# Patient Record
Sex: Male | Born: 1955 | Race: White | Hispanic: No | Marital: Married | Smoking: Never smoker
Health system: Southern US, Community
[De-identification: ages and names within clinical notes are randomized; demographics above are authoritative.]

## PROBLEM LIST (undated history)

## (undated) DIAGNOSIS — I10 Essential (primary) hypertension: Secondary | ICD-10-CM

## (undated) DIAGNOSIS — G4730 Sleep apnea, unspecified: Secondary | ICD-10-CM

## (undated) DIAGNOSIS — M51369 Other intervertebral disc degeneration, lumbar region without mention of lumbar back pain or lower extremity pain: Secondary | ICD-10-CM

## (undated) DIAGNOSIS — M5136 Other intervertebral disc degeneration, lumbar region: Secondary | ICD-10-CM

## (undated) DIAGNOSIS — E119 Type 2 diabetes mellitus without complications: Secondary | ICD-10-CM

## (undated) DIAGNOSIS — M7918 Myalgia, other site: Secondary | ICD-10-CM

## (undated) DIAGNOSIS — M7542 Impingement syndrome of left shoulder: Secondary | ICD-10-CM

## (undated) DIAGNOSIS — Z87438 Personal history of other diseases of male genital organs: Secondary | ICD-10-CM

## (undated) HISTORY — PX: BACK SURGERY: SHX140

## (undated) HISTORY — DX: Essential (primary) hypertension: I10

## (undated) HISTORY — PX: COLONOSCOPY: SHX174

## (undated) HISTORY — PX: WISDOM TOOTH EXTRACTION: SHX21

---

## 2004-06-03 ENCOUNTER — Emergency Department: Payer: Self-pay | Admitting: Emergency Medicine

## 2006-09-04 ENCOUNTER — Ambulatory Visit: Payer: Self-pay | Admitting: Gastroenterology

## 2007-09-16 ENCOUNTER — Ambulatory Visit: Payer: Self-pay | Admitting: General Practice

## 2009-03-02 ENCOUNTER — Ambulatory Visit: Payer: Self-pay | Admitting: Neurosurgery

## 2009-03-12 ENCOUNTER — Encounter: Admission: RE | Admit: 2009-03-12 | Discharge: 2009-03-12 | Payer: Self-pay | Admitting: Neurosurgery

## 2009-03-23 ENCOUNTER — Ambulatory Visit (HOSPITAL_COMMUNITY): Admission: RE | Admit: 2009-03-23 | Discharge: 2009-03-23 | Payer: Self-pay | Admitting: Neurosurgery

## 2010-05-22 ENCOUNTER — Encounter: Payer: Self-pay | Admitting: Neurosurgery

## 2010-08-03 LAB — DIFFERENTIAL
Basophils Absolute: 0 10*3/uL (ref 0.0–0.1)
Basophils Relative: 0 % (ref 0–1)
Neutro Abs: 4.5 10*3/uL (ref 1.7–7.7)
Neutrophils Relative %: 62 % (ref 43–77)

## 2010-08-03 LAB — CBC
MCHC: 33.5 g/dL (ref 30.0–36.0)
Platelets: 238 10*3/uL (ref 150–400)
RDW: 13.8 % (ref 11.5–15.5)

## 2010-08-03 LAB — BASIC METABOLIC PANEL
CO2: 26 mEq/L (ref 19–32)
Calcium: 10.2 mg/dL (ref 8.4–10.5)
Creatinine, Ser: 0.8 mg/dL (ref 0.4–1.5)
Glucose, Bld: 125 mg/dL — ABNORMAL HIGH (ref 70–99)

## 2017-03-02 DIAGNOSIS — M7918 Myalgia, other site: Secondary | ICD-10-CM | POA: Insufficient documentation

## 2017-03-02 DIAGNOSIS — Z87438 Personal history of other diseases of male genital organs: Secondary | ICD-10-CM | POA: Insufficient documentation

## 2017-03-02 DIAGNOSIS — M5136 Other intervertebral disc degeneration, lumbar region: Secondary | ICD-10-CM | POA: Insufficient documentation

## 2017-03-02 DIAGNOSIS — M7542 Impingement syndrome of left shoulder: Secondary | ICD-10-CM | POA: Insufficient documentation

## 2017-06-14 ENCOUNTER — Encounter: Payer: Self-pay | Admitting: *Deleted

## 2017-06-15 ENCOUNTER — Encounter: Payer: Self-pay | Admitting: *Deleted

## 2017-06-15 ENCOUNTER — Other Ambulatory Visit: Payer: Self-pay

## 2017-06-15 ENCOUNTER — Ambulatory Visit: Payer: BLUE CROSS/BLUE SHIELD | Admitting: Anesthesiology

## 2017-06-15 ENCOUNTER — Encounter
Admission: RE | Disposition: A | Discharge status: Home / Self Care | Payer: Self-pay | Source: Ambulatory Visit | Attending: Unknown Physician Specialty

## 2017-06-15 ENCOUNTER — Ambulatory Visit
Admission: RE | Admit: 2017-06-15 | Discharge: 2017-06-15 | Disposition: A | Discharge status: Home / Self Care | Payer: BLUE CROSS/BLUE SHIELD | Source: Ambulatory Visit | Attending: Unknown Physician Specialty | Admitting: Unknown Physician Specialty

## 2017-06-15 DIAGNOSIS — Z1211 Encounter for screening for malignant neoplasm of colon: Secondary | ICD-10-CM | POA: Diagnosis not present

## 2017-06-15 DIAGNOSIS — Z87891 Personal history of nicotine dependence: Secondary | ICD-10-CM | POA: Insufficient documentation

## 2017-06-15 DIAGNOSIS — K5730 Diverticulosis of large intestine without perforation or abscess without bleeding: Secondary | ICD-10-CM | POA: Insufficient documentation

## 2017-06-15 DIAGNOSIS — E119 Type 2 diabetes mellitus without complications: Secondary | ICD-10-CM | POA: Insufficient documentation

## 2017-06-15 DIAGNOSIS — K640 First degree hemorrhoids: Secondary | ICD-10-CM | POA: Insufficient documentation

## 2017-06-15 DIAGNOSIS — G4730 Sleep apnea, unspecified: Secondary | ICD-10-CM | POA: Diagnosis not present

## 2017-06-15 HISTORY — PX: COLONOSCOPY WITH PROPOFOL: SHX5780

## 2017-06-15 HISTORY — DX: Sleep apnea, unspecified: G47.30

## 2017-06-15 HISTORY — DX: Type 2 diabetes mellitus without complications: E11.9

## 2017-06-15 HISTORY — DX: Personal history of other diseases of male genital organs: Z87.438

## 2017-06-15 HISTORY — DX: Impingement syndrome of left shoulder: M75.42

## 2017-06-15 HISTORY — DX: Myalgia, other site: M79.18

## 2017-06-15 HISTORY — DX: Other intervertebral disc degeneration, lumbar region: M51.36

## 2017-06-15 HISTORY — DX: Other intervertebral disc degeneration, lumbar region without mention of lumbar back pain or lower extremity pain: M51.369

## 2017-06-15 SURGERY — COLONOSCOPY WITH PROPOFOL
Anesthesia: General

## 2017-06-15 MED ORDER — LIDOCAINE HCL (PF) 2 % IJ SOLN
INTRAMUSCULAR | Status: AC
  Filled 2017-06-15: qty 10

## 2017-06-15 MED ORDER — FENTANYL CITRATE (PF) 100 MCG/2ML IJ SOLN
INTRAMUSCULAR | Status: DC | PRN
  Administered 2017-06-15: 50 ug via INTRAVENOUS

## 2017-06-15 MED ORDER — SODIUM CHLORIDE 0.9 % IV SOLN
INTRAVENOUS | Status: DC
  Administered 2017-06-15 (×2): via INTRAVENOUS

## 2017-06-15 MED ORDER — PROPOFOL 500 MG/50ML IV EMUL
INTRAVENOUS | Status: DC | PRN
  Administered 2017-06-15: 160 ug/kg/min via INTRAVENOUS

## 2017-06-15 MED ORDER — SODIUM CHLORIDE 0.9 % IV SOLN: INTRAVENOUS | Status: DC

## 2017-06-15 MED ORDER — PROPOFOL 10 MG/ML IV BOLUS
INTRAVENOUS | Status: DC | PRN
  Administered 2017-06-15: 100 mg via INTRAVENOUS

## 2017-06-15 MED ORDER — FENTANYL CITRATE (PF) 100 MCG/2ML IJ SOLN
INTRAMUSCULAR | Status: AC
  Filled 2017-06-15: qty 2

## 2017-06-15 MED ORDER — PROPOFOL 10 MG/ML IV BOLUS
INTRAVENOUS | Status: AC
  Filled 2017-06-15: qty 20

## 2017-06-15 MED ORDER — PROPOFOL 500 MG/50ML IV EMUL
INTRAVENOUS | Status: AC
  Filled 2017-06-15: qty 50

## 2017-06-15 MED ORDER — SODIUM CHLORIDE 0.9 % IJ SOLN
INTRAMUSCULAR | Status: AC
  Filled 2017-06-15: qty 10

## 2017-06-15 MED ORDER — PHENYLEPHRINE HCL 10 MG/ML IJ SOLN
INTRAMUSCULAR | Status: AC
Start: 2017-06-15 — End: ?
  Filled 2017-06-15: qty 1

## 2017-06-15 MED ORDER — LIDOCAINE 2% (20 MG/ML) 5 ML SYRINGE
INTRAMUSCULAR | Status: DC | PRN
  Administered 2017-06-15: 30 mg via INTRAVENOUS

## 2017-06-15 MED ORDER — PHENYLEPHRINE HCL 10 MG/ML IJ SOLN
INTRAMUSCULAR | Status: DC | PRN
  Administered 2017-06-15: 100 ug via INTRAVENOUS

## 2017-06-15 NOTE — Anesthesia Post-op Follow-up Note (Signed)
Anesthesia QCDR form completed.        

## 2017-06-15 NOTE — Op Note (Signed)
[facility] Gastroenterology Patient Name: Mitchell Whitehead Procedure Date: 06/15/2017 7:18 AM MRN: [id number] Account #: [id number] Date of Birth: Apr 27, 1956 Admit Type: Outpatient Age: 62 Room: [facility] ENDO ROOM 1 Gender: Male Note Status: Finalized Procedure:            Colonoscopy Indications:          Screening for colorectal malignant neoplasm Providers:            Scot Jun, MD Referring MD:         Bunnie Philips. Rabinovich (Referring MD) Medicines:            Propofol per Anesthesia Complications:        No immediate complications. Procedure:            Pre-Anesthesia Assessment:                       - After reviewing the risks and benefits, the patient                        was deemed in satisfactory condition to undergo the                        procedure.                       After obtaining informed consent, the colonoscope was                        passed under direct vision. Throughout the procedure,                        the patient's blood pressure, pulse, and oxygen                        saturations were monitored continuously. The                        Colonoscope was introduced through the anus and                        advanced to the the cecum, identified by appendiceal                        orifice and ileocecal valve. The patient tolerated the                        procedure well. The quality of the bowel preparation                        was good. Findings:      A single small-mouthed diverticulum was found in the ascending colon.      Internal hemorrhoids were found during endoscopy. The hemorrhoids were       small and Grade I (internal hemorrhoids that do not prolapse).      The exam was otherwise without abnormality. Impression:           - Diverticulosis in the ascending colon.                       - Internal hemorrhoids.                       -  The examination was otherwise normal.                       - No specimens  collected. Recommendation:       - The findings and recommendations were discussed with                        the patient's family. Scot Junobert T Kendrea Cerritos, MD 06/15/2017 7:58:29 AM This report has been signed electronically. Number of Addenda: 0 Note Initiated On: 06/15/2017 7:18 AM Scope Withdrawal Time: 0 hours 8 minutes 36 seconds  Total Procedure Duration: 0 hours 15 minutes 15 seconds       [facility]

## 2017-06-15 NOTE — Anesthesia Preprocedure Evaluation (Signed)
Anesthesia Evaluation  Patient identified by MRN, date of birth, ID band Patient awake    Reviewed: Allergy & Precautions, NPO status , Patient's Chart, lab work & pertinent test results, reviewed documented beta blocker date and time   Airway Mallampati: III  TM Distance: >3 FB     Dental  (+) Chipped   Pulmonary sleep apnea , former smoker,           Cardiovascular      Neuro/Psych    GI/Hepatic   Endo/Other  diabetes, Type 2  Renal/GU      Musculoskeletal  (+) Arthritis ,   Abdominal   Peds  Hematology   Anesthesia Other Findings   Reproductive/Obstetrics                             Anesthesia Physical Anesthesia Plan  ASA: III  Anesthesia Plan: General   Post-op Pain Management:    Induction: Intravenous  PONV Risk Score and Plan:   Airway Management Planned:   Additional Equipment:   Intra-op Plan:   Post-operative Plan:   Informed Consent: I have reviewed the patients History and Physical, chart, labs and discussed the procedure including the risks, benefits and alternatives for the proposed anesthesia with the patient or authorized representative who has indicated his/her understanding and acceptance.     Plan Discussed with: CRNA  Anesthesia Plan Comments:         Anesthesia Quick Evaluation

## 2017-06-15 NOTE — Anesthesia Postprocedure Evaluation (Signed)
Anesthesia Post Note  Patient: Mitchell Whitehead  Procedure(s) Performed: COLONOSCOPY WITH PROPOFOL (N/A )  Patient location during evaluation: Endoscopy Anesthesia Type: General Level of consciousness: awake and alert Pain management: pain level controlled Vital Signs Assessment: post-procedure vital signs reviewed and stable Respiratory status: spontaneous breathing, nonlabored ventilation, respiratory function stable and patient connected to nasal cannula oxygen Cardiovascular status: blood pressure returned to baseline and stable Postop Assessment: no apparent nausea or vomiting Anesthetic complications: no     Last Vitals:  Vitals:   06/15/17 0807 06/15/17 0841  BP: 129/73 140/82  Pulse:  70  Resp:  18  Temp:    SpO2:  100%    Last Pain:  Vitals:   06/15/17 0801  TempSrc: Tympanic                 Maeson Purohit S

## 2017-06-15 NOTE — Transfer of Care (Signed)
Immediate Anesthesia Transfer of Care Note  Patient: Mitchell Whitehead  Procedure(s) Performed: COLONOSCOPY WITH PROPOFOL (N/A )  Patient Location: PACU and Endoscopy Unit  Anesthesia Type:General  Level of Consciousness: sedated  Airway & Oxygen Therapy: Patient Spontanous Breathing and Patient connected to nasal cannula oxygen  Post-op Assessment: Report given to RN and Post -op Vital signs reviewed and stable  Post vital signs: Reviewed and stable  Last Vitals:  Vitals:   06/15/17 0707  BP: (!) 148/94  Pulse: 77  Resp: 20  Temp: (!) 35.7 C  SpO2: 99%    Last Pain:  Vitals:   06/15/17 0707  TempSrc: Tympanic      Patients Stated Pain Goal: 9 (06/15/17 0707)  Complications: No apparent anesthesia complications

## 2017-06-15 NOTE — H&P (Signed)
   Primary Care Physician:  Gilles Chiquitoabinowitz, Phill H, MD Primary Gastroenterologist:  Dr. Mechele CollinElliott  Pre-Procedure History & Physical: HPI:  Mitchell NewerJoseph D Hodges is a 62 y.o. male is here for an colonoscopy.   Past Medical History:  Diagnosis Date  . Degenerative disc disease, lumbar   . Diabetes mellitus without complication (HCC)   . History of prostatitis   . Impingement syndrome of left shoulder   . Left buttock pain   . Sleep apnea     Past Surgical History:  Procedure Laterality Date  . BACK SURGERY    . COLONOSCOPY    . WISDOM TOOTH EXTRACTION      Prior to Admission medications   Medication Sig Start Date End Date Taking? Authorizing Provider  ibuprofen (ADVIL,MOTRIN) 200 MG tablet Take 200 mg by mouth every 6 (six) hours as needed for moderate pain.   Yes [provider]  meloxicam (MOBIC) 15 MG tablet Take 15 mg by mouth as needed for pain.   Yes [provider]    Allergies as of 03/19/2017  . (Not on File)    History reviewed. No pertinent family history.  Social History   Socioeconomic History  . Marital status: Married    Spouse name: Not on file  . Number of children: Not on file  . Years of education: Not on file  . Highest education level: Not on file  Social Needs  . Financial resource strain: Not on file  . Food insecurity - worry: Not on file  . Food insecurity - inability: Not on file  . Transportation needs - medical: Not on file  . Transportation needs - non-medical: Not on file  Occupational History  . Not on file  Tobacco Use  . Smoking status: Former Games developermoker  . Smokeless tobacco: Never Used  Substance and Sexual Activity  . Alcohol use: Yes    Alcohol/week: 4.2 oz    Types: 7 Cans of beer per week    Comment: Daily or almost daily   . Drug use: No  . Sexual activity: Yes  Other Topics Concern  . Not on file  Social History Narrative  . Not on file    Review of Systems: See HPI, otherwise negative ROS-----Patient with  elevated A1C of 11.5 and was recommended to see doctor about this as he is not taking any medicine.  Physical Exam: BP (!) 148/94   Pulse 77   Temp (!) 96.2 F (35.7 C) (Tympanic)   Resp 20   Ht 6\' 1"  (1.854 m)   Wt 99.8 kg (220 lb)   SpO2 99%   BMI 29.03 kg/m  General:   Alert,  pleasant and cooperative in NAD Head:  Normocephalic and atraumatic. Neck:  Supple; no masses or thyromegaly. Lungs:  Clear throughout to auscultation.    Heart:  Regular rate and rhythm. Abdomen:  Soft, nontender and nondistended. Normal bowel sounds, without guarding, and without rebound.   Neurologic:  Alert and  oriented x4;  grossly normal neurologically.  Impression/Plan: Mitchell Whitehead is here for an colonoscopy to be performed for screening colonoscopy.  Risks, benefits, limitations, and alternatives regarding  colonoscopy have been reviewed with the patient.  Questions have been answered.  All parties agreeable.   Lynnae PrudeELLIOTT, Joanny Dupree, MD  06/15/2017, 7:30 AM

## 2017-06-16 NOTE — Progress Notes (Signed)
Voicemail. No message left.

## 2017-06-18 ENCOUNTER — Encounter: Payer: Self-pay | Admitting: Unknown Physician Specialty

## 2018-05-22 ENCOUNTER — Other Ambulatory Visit: Payer: Self-pay | Admitting: Physician Assistant

## 2018-05-22 DIAGNOSIS — R221 Localized swelling, mass and lump, neck: Secondary | ICD-10-CM

## 2018-05-28 ENCOUNTER — Ambulatory Visit
Admission: RE | Admit: 2018-05-28 | Discharge: 2018-05-28 | Disposition: A | Discharge status: Home / Self Care | Payer: BLUE CROSS/BLUE SHIELD | Source: Ambulatory Visit | Attending: Physician Assistant | Admitting: Physician Assistant

## 2018-05-28 DIAGNOSIS — R221 Localized swelling, mass and lump, neck: Secondary | ICD-10-CM | POA: Diagnosis not present

## 2018-12-23 ENCOUNTER — Other Ambulatory Visit: Payer: Self-pay

## 2018-12-23 ENCOUNTER — Ambulatory Visit: Payer: 59

## 2018-12-23 DIAGNOSIS — Z01818 Encounter for other preprocedural examination: Secondary | ICD-10-CM

## 2018-12-23 LAB — POCT URINALYSIS DIPSTICK
Bilirubin, UA: NEGATIVE
Blood, UA: NEGATIVE
Glucose, UA: POSITIVE — AB
Ketones, UA: NEGATIVE
Leukocytes, UA: NEGATIVE
Nitrite, UA: NEGATIVE
Protein, UA: NEGATIVE
Spec Grav, UA: 1.01 (ref 1.010–1.025)
Urobilinogen, UA: 0.2 E.U./dL
pH, UA: 6 (ref 5.0–8.0)

## 2018-12-24 LAB — CMP12+LP+TP+TSH+6AC+PSA+CBC…
Albumin: 4.3 g/dL (ref 3.8–4.8)
Alkaline Phosphatase: 61 IU/L (ref 39–117)
BUN/Creatinine Ratio: 24 (ref 10–24)
BUN: 19 mg/dL (ref 8–27)
Basos: 1 %
Bilirubin Total: 0.5 mg/dL (ref 0.0–1.2)
Chol/HDL Ratio: 5.9 ratio — ABNORMAL HIGH (ref 0.0–5.0)
Cholesterol, Total: 272 mg/dL — ABNORMAL HIGH (ref 100–199)
Creatinine, Ser: 0.79 mg/dL (ref 0.76–1.27)
EOS (ABSOLUTE): 0.2 10*3/uL (ref 0.0–0.4)
Eos: 4 %
Estimated CHD Risk: 1.3 times avg. — ABNORMAL HIGH (ref 0.0–1.0)
Free Thyroxine Index: 1.6 (ref 1.2–4.9)
GFR calc Af Amer: 110 mL/min/{1.73_m2} (ref >59)
GFR calc non Af Amer: 96 mL/min/{1.73_m2} (ref >59)
Globulin, Total: 2.6 g/dL (ref 1.5–4.5)
Glucose: 167 mg/dL — ABNORMAL HIGH (ref 65–99)
HDL: 46 mg/dL (ref >39)
Hematocrit: 47.6 % (ref 37.5–51.0)
Hemoglobin: 15.8 g/dL (ref 13.0–17.7)
Immature Grans (Abs): 0 10*3/uL (ref 0.0–0.1)
Immature Granulocytes: 0 %
Iron: 152 ug/dL (ref 38–169)
LDL Calculated: 192 mg/dL — ABNORMAL HIGH (ref 0–99)
Lymphocytes Absolute: 1.5 10*3/uL (ref 0.7–3.1)
Lymphs: 29 %
MCHC: 33.2 g/dL (ref 31.5–35.7)
MCV: 88 fL (ref 79–97)
Monocytes Absolute: 0.7 10*3/uL (ref 0.1–0.9)
Monocytes: 13 %
Neutrophils Absolute: 2.8 10*3/uL (ref 1.4–7.0)
Phosphorus: 3.4 mg/dL (ref 2.8–4.1)
Potassium: 4.1 mmol/L (ref 3.5–5.2)
RDW: 13.9 % (ref 11.6–15.4)
T3 Uptake Ratio: 28 % (ref 24–39)
TSH: 1.65 u[IU]/mL (ref 0.450–4.500)
Total Protein: 6.9 g/dL (ref 6.0–8.5)
Uric Acid: 5.8 mg/dL (ref 3.7–8.6)
VLDL Cholesterol Cal: 34 mg/dL (ref 5–40)

## 2018-12-24 LAB — CMP12+LP+TP+TSH+6AC+PSA+CBC?
ALT: 22 IU/L (ref 0–44)
AST: 13 IU/L (ref 0–40)
Albumin/Globulin Ratio: 1.7 (ref 1.2–2.2)
Basophils Absolute: 0.1 10*3/uL (ref 0.0–0.2)
Calcium: 9.8 mg/dL (ref 8.6–10.2)
Chloride: 101 mmol/L (ref 96–106)
GGT: 54 IU/L (ref 0–65)
LDH: 144 IU/L (ref 121–224)
MCH: 29.1 pg (ref 26.6–33.0)
Neutrophils: 53 %
Platelets: 243 10*3/uL (ref 150–450)
Prostate Specific Ag, Serum: 0.3 ng/mL (ref 0.0–4.0)
RBC: 5.43 x10E6/uL (ref 4.14–5.80)
Sodium: 137 mmol/L (ref 134–144)
T4, Total: 5.8 ug/dL (ref 4.5–12.0)
Triglycerides: 172 mg/dL — ABNORMAL HIGH (ref 0–149)
WBC: 5.2 10*3/uL (ref 3.4–10.8)

## 2018-12-24 LAB — MICROALBUMIN / CREATININE URINE RATIO
Creatinine, Urine: 17.4 mg/dL
Microalb/Creat Ratio: <17 mg/g creat (ref 0–29)
Microalbumin, Urine: <3 ug/mL

## 2018-12-24 LAB — HGB A1C W/O EAG: Hgb A1c MFr Bld: 7.1 % — ABNORMAL HIGH (ref 4.8–5.6)

## 2018-12-26 ENCOUNTER — Encounter: Payer: 59 | Admitting: Internal Medicine

## 2019-01-02 ENCOUNTER — Ambulatory Visit: Payer: Self-pay | Admitting: Internal Medicine

## 2019-01-02 ENCOUNTER — Other Ambulatory Visit: Payer: Self-pay

## 2019-01-02 ENCOUNTER — Encounter: Payer: Self-pay | Admitting: Internal Medicine

## 2019-01-02 VITALS — BP 141/83 | HR 90 | Temp 98.5°F | Resp 16 | Ht 73.0 in | Wt 224.0 lb

## 2019-01-02 DIAGNOSIS — I10 Essential (primary) hypertension: Secondary | ICD-10-CM

## 2019-01-02 DIAGNOSIS — Z9889 Other specified postprocedural states: Secondary | ICD-10-CM | POA: Insufficient documentation

## 2019-01-02 DIAGNOSIS — E119 Type 2 diabetes mellitus without complications: Secondary | ICD-10-CM | POA: Insufficient documentation

## 2019-01-02 DIAGNOSIS — E6609 Other obesity due to excess calories: Secondary | ICD-10-CM | POA: Insufficient documentation

## 2019-01-02 DIAGNOSIS — G4733 Obstructive sleep apnea (adult) (pediatric): Secondary | ICD-10-CM | POA: Insufficient documentation

## 2019-01-02 DIAGNOSIS — E7849 Other hyperlipidemia: Secondary | ICD-10-CM | POA: Insufficient documentation

## 2019-01-02 MED ORDER — ATORVASTATIN CALCIUM 10 MG PO TABS: 10.0000 mg | ORAL_TABLET | Freq: Every day | ORAL | 3 refills | Status: DC

## 2019-01-02 NOTE — Progress Notes (Signed)
S  - 63 y.o. white male who presents for annual physical evaluation  No specific complaints today, denies any recent CP, palpitations, SOB, abdominal pains, change in bowel habits, dark/black stools, vision changes and sees his eye doctor yearly, recent fevers, or other Covid concerning sx's, up once a night at most to pee, sometimes not at all, no hesitancy, no dysuria, no LE edema, no joint pains, no numbness or tingling in his ext's, notes occas LB pains, not severe and manageable  He was dx'ed with sleep apnea in his past, dx'ed years ago and tried CPAP and not do well with this and not use since, he denies any problems with marked daytime somnolence, noted sometimes fatigued in day, does snore per his wife. He is not anxious for another trial at this time.  Exercise - no regular exercise Diet - not adherent to a very healthy diet noted  Meds reviewed Current Outpatient Medications on File Prior to Visit  Medication Sig Dispense Refill  . FARXIGA 10 MG TABS tablet TK 1 T PO QD    . glipiZIDE (GLUCOTROL XL) 5 MG 24 hr tablet TK 1 T PO QD    . ibuprofen (ADVIL,MOTRIN) 200 MG tablet Take 200 mg by mouth every 6 (six) hours as needed for moderate pain.    Marland Kitchen losartan (COZAAR) 50 MG tablet TK 1 T PO QD     No current facility-administered medications on file prior to visit.      Allergies  Allergen Reactions  . Grapeseed Extract [Nutritional Supplements]     Social History   Tobacco Use  Smoking Status Never Smoker  Smokeless Tobacco Never Used     FH - M - lung CA  O - NAD, masked, overweight  BP (!) 141/83 (BP Location: Left Arm, Patient Position: Sitting, Cuff Size: Large)   Pulse 90   Temp 98.5 F (36.9 C) (Oral)   Resp 16   Ht 6\' 1"  (1.854 m)   Wt 224 lb (101.6 kg)   SpO2 96%   BMI 29.55 kg/m   HEENT - sclera anicteric, + glasses, PERRL, EOMI, conj - non-inj'ed, No sinus tenderness, TM's and canals clear Neck - supple, no adenopathy, no TM, carotids 2+ and = without  bruits bilat Car - RRR without m/g/r Pulm- CTA without wheeze or rales Abd - soft, obese, NT, ND, BS+, no obvious HSM, no masses Back - no CVA tenderness Skin- no new lesions of concern on exposed areas, denied otherwise, + skin tag left neck and right lower back towards flank, + scar lower back from surgery,  Ext - no LE edema, no active joints GU - no swelling in inguinal/suprapubic region, NT,  testicle and prostate exams deferred (without concerning sx's and after discussion on current recommendations for prostate CA screening including PSA tests) Neuro - affect was not flat, appropriate with conversation  Grossly non-focal with good strength on testing, sensation intact to LT in distal extremities, DTR's 2+ and = patella, Romberg neg, no pronator drift, good balance on one foot, good finger to nose   Labs reviewed - UA - + for glc (from medicinel taking), glc - 167, A1C - 7.1 (7.7 in May 2019 and in the 11's on a couple checks prior), LDL - 192, TC - 272, TG - 172, HDL - 46, PSA - 0.3, microalb neg ECG reviewed - no concerning changes from prior ECG Colonoscopy screening discussed and reviewed - last one 06/2017 - clean and f/u in 10 years  rec'ed  Ass/Plan: 1. HTN - controlled on medicines, systolic BP just borderline today  Continue medication to manage Discussed goals for good control of BP  Importance of healthy diet and regular aerobic exercise and weight control noted Continue to monitor  2. NIDDM -better controlled recent past.  Continue current medication On ARB and cont to follow urine for albumin/microalb Importance of diet modifications emphasized Importance of some regular aerobic exercise encouraged Above to help with weight control/weight loss also important  Has routine eye examinations and recommended following up for regular assessments yearly as doing and importance  3.  Hyperlipidemia - LDL much higher on recent check  Educated on the role of medications,  risk/benefits and a statin rec'ed and he will take Lipitor - 10mg  daily in the pm rec'ed and may need to increase over time as this is low dose, but felt best starting with this dose Importance of diet modifications emphasized, Importance of some regular aerobic exercise encouraged Above to help with weight control/weight loss also very important   4. Increased BMI/ overweight Importance of diet and exercise as above and weight control noted (noted concern if continues to gain weight, likely will worsen BP and BS control)   5. H/o back surgery - has occas back aches but manageable presently  6. Sleep apnea history - as noted above

## 2019-01-31 ENCOUNTER — Ambulatory Visit: Payer: Self-pay

## 2019-01-31 DIAGNOSIS — Z23 Encounter for immunization: Secondary | ICD-10-CM

## 2019-04-08 ENCOUNTER — Ambulatory Visit: Payer: Self-pay

## 2019-04-10 ENCOUNTER — Ambulatory Visit: Payer: Self-pay | Admitting: Physician Assistant

## 2019-04-10 ENCOUNTER — Encounter: Payer: Self-pay | Admitting: Physician Assistant

## 2019-04-10 ENCOUNTER — Other Ambulatory Visit: Payer: Self-pay

## 2019-04-10 VITALS — BP 142/76 | HR 78 | Temp 98.9°F | Resp 16 | Ht 74.0 in | Wt 227.0 lb

## 2019-04-10 DIAGNOSIS — E119 Type 2 diabetes mellitus without complications: Secondary | ICD-10-CM

## 2019-04-10 LAB — POCT GLYCOSYLATED HEMOGLOBIN (HGB A1C)
HbA1c, POC (controlled diabetic range): 7 % (ref 0.0–7.0)
Hemoglobin A1C: 7 % — AB (ref 4.0–5.6)

## 2019-04-10 NOTE — Progress Notes (Signed)
   Subjective:    Patient ID: Mitchell Whitehead, male    DOB: 04/15/1956, 63 y.o.   MRN: [id number]  HPI  3 month f/u for DM    Had annual  01/02/19 Dr Roxan Hockey Wilder Glade 10 mg 1 QD Glipizide ER 5 mg 1 po QD  01/02/19   224     141/83    A1C 7.1 Today   227       142/76   A1C  7.o  Chart note- not wearing CPAP- discussed significant impact that sleep apnea has on cardiovascular heath. Wife says he snores He readily admits that he felt so much better when using it - but hasn't done so for years. Encouraged to reconsider. He doesn't want to face expense of updated Sleep study- didn't realize that Home study now available.  He will think about it- we can help with understanding details if he gives [place] the go ahead  States that taking lipitor and not having any difficulty with it Needs to seriously address walking program "Exercise on any day that you eat "  This should benefit HTN, GM, sleep apneaand general well being   Review of Systems Sleep apnea as above    Objective:   Physical Exam Nursing note reviewed.  Constitutional:      Appearance: Normal appearance.  Neurological:     Mental Status: He is alert.          Assessment & Plan:  Requests refill of Atorvastatin  30 min brisk walk daily  Increased awareness dietary choices and serving sizes 1/2 lb weight loss weekly goal Made a verbal  contract   F/U 3 months Retires in April

## 2019-04-21 ENCOUNTER — Other Ambulatory Visit: Payer: Self-pay | Admitting: Physician Assistant

## 2019-04-21 MED ORDER — ATORVASTATIN CALCIUM 10 MG PO TABS: 10.0000 mg | ORAL_TABLET | Freq: Every day | ORAL | 3 refills | Status: DC

## 2019-04-21 NOTE — Progress Notes (Unsigned)
Renew atorvastatin RX Weight loss and improved dietary choices  See in f/u 3 months DM   BP   Wt

## 2019-07-03 ENCOUNTER — Other Ambulatory Visit: Payer: Self-pay

## 2019-07-10 ENCOUNTER — Ambulatory Visit: Payer: Self-pay

## 2019-07-14 NOTE — Progress Notes (Signed)
Patient comes in today for 6 month labs.Office visit scheduled with Albina Billet, PA-C.

## 2019-07-15 ENCOUNTER — Other Ambulatory Visit: Payer: Self-pay

## 2019-07-15 DIAGNOSIS — E119 Type 2 diabetes mellitus without complications: Secondary | ICD-10-CM

## 2019-07-16 LAB — BUN+CREAT
BUN/Creatinine Ratio: 18 (ref 10–24)
BUN: 15 mg/dL (ref 8–27)
Creatinine, Ser: 0.84 mg/dL (ref 0.76–1.27)
GFR calc Af Amer: 108 mL/min/{1.73_m2} (ref >59)
GFR calc non Af Amer: 93 mL/min/{1.73_m2} (ref >59)

## 2019-07-16 LAB — HGB A1C W/O EAG: Hgb A1c MFr Bld: 8.1 % — ABNORMAL HIGH (ref 4.8–5.6)

## 2019-07-16 NOTE — Progress Notes (Signed)
Scheduled for follow-up appt on 07/21/19 to review lab results.  AMD

## 2019-07-21 ENCOUNTER — Ambulatory Visit: Payer: Self-pay | Admitting: Registered Nurse

## 2019-07-21 ENCOUNTER — Encounter: Payer: Self-pay | Admitting: Registered Nurse

## 2019-07-21 ENCOUNTER — Other Ambulatory Visit: Payer: Self-pay

## 2019-07-21 VITALS — BP 130/80 | HR 94 | Temp 99.5°F | Resp 12 | Ht 74.0 in | Wt 228.0 lb

## 2019-07-21 DIAGNOSIS — G4733 Obstructive sleep apnea (adult) (pediatric): Secondary | ICD-10-CM

## 2019-07-21 DIAGNOSIS — E7849 Other hyperlipidemia: Secondary | ICD-10-CM

## 2019-07-21 DIAGNOSIS — I10 Essential (primary) hypertension: Secondary | ICD-10-CM

## 2019-07-21 DIAGNOSIS — M5136 Other intervertebral disc degeneration, lumbar region: Secondary | ICD-10-CM

## 2019-07-21 DIAGNOSIS — M51369 Other intervertebral disc degeneration, lumbar region without mention of lumbar back pain or lower extremity pain: Secondary | ICD-10-CM

## 2019-07-21 DIAGNOSIS — Z6829 Body mass index (BMI) 29.0-29.9, adult: Secondary | ICD-10-CM

## 2019-07-21 DIAGNOSIS — E119 Type 2 diabetes mellitus without complications: Secondary | ICD-10-CM

## 2019-07-21 DIAGNOSIS — B353 Tinea pedis: Secondary | ICD-10-CM

## 2019-07-21 NOTE — Patient Instructions (Signed)
Diabetes Mellitus and Exercise Exercising regularly is important for your overall health, especially when you have diabetes (diabetes mellitus). Exercising is not only about losing weight. It has many other health benefits, such as increasing muscle strength and bone density and reducing body fat and stress. This leads to improved fitness, flexibility, and endurance, all of which result in better overall health. Exercise has additional benefits for people with diabetes, including:  Reducing appetite.  Helping to lower and control blood glucose.  Lowering blood pressure.  Helping to control amounts of fatty substances (lipids) in the blood, such as cholesterol and triglycerides.  Helping the body to respond better to insulin (improving insulin sensitivity).  Reducing how much insulin the body needs.  Decreasing the risk for heart disease by: ? Lowering cholesterol and triglyceride levels. ? Increasing the levels of good cholesterol. ? Lowering blood glucose levels. What is my activity plan? Your health care provider or certified diabetes educator can help you make a plan for the type and frequency of exercise (activity plan) that works for you. Make sure that you:  Do at least 150 minutes of moderate-intensity or vigorous-intensity exercise each week. This could be brisk walking, biking, or water aerobics. ? Do stretching and strength exercises, such as yoga or weightlifting, at least 2 times a week. ? Spread out your activity over at least 3 days of the week.  Get some form of physical activity every day. ? Do not go more than 2 days in a row without some kind of physical activity. ? Avoid being inactive for more than 30 minutes at a time. Take frequent breaks to walk or stretch.  Choose a type of exercise or activity that you enjoy, and set realistic goals.  Start slowly, and gradually increase the intensity of your exercise over time. What do I need to know about managing my  diabetes?   Check your blood glucose before and after exercising. ? If your blood glucose is 240 mg/dL (13.3 mmol/L) or higher before you exercise, check your urine for ketones. If you have ketones in your urine, do not exercise until your blood glucose returns to normal. ? If your blood glucose is 100 mg/dL (5.6 mmol/L) or lower, eat a snack containing 15-20 grams of carbohydrate. Check your blood glucose 15 minutes after the snack to make sure that your level is above 100 mg/dL (5.6 mmol/L) before you start your exercise.  Know the symptoms of low blood glucose (hypoglycemia) and how to treat it. Your risk for hypoglycemia increases during and after exercise. Common symptoms of hypoglycemia can include: ? Hunger. ? Anxiety. ? Sweating and feeling clammy. ? Confusion. ? Dizziness or feeling light-headed. ? Increased heart rate or palpitations. ? Blurry vision. ? Tingling or numbness around the mouth, lips, or tongue. ? Tremors or shakes. ? Irritability.  Keep a rapid-acting carbohydrate snack available before, during, and after exercise to help prevent or treat hypoglycemia.  Avoid injecting insulin into areas of the body that are going to be exercised. For example, avoid injecting insulin into: ? The arms, when playing tennis. ? The legs, when jogging.  Keep records of your exercise habits. Doing this can help you and your health care provider adjust your diabetes management plan as needed. Write down: ? Food that you eat before and after you exercise. ? Blood glucose levels before and after you exercise. ? The type and amount of exercise you have done. ? When your insulin is expected to peak, if you use   insulin. Avoid exercising at times when your insulin is peaking.  When you start a new exercise or activity, work with your health care provider to make sure the activity is safe for you, and to adjust your insulin, medicines, or food intake as needed.  Drink plenty of water while  you exercise to prevent dehydration or heat stroke. Drink enough fluid to keep your urine clear or pale yellow. Summary  Exercising regularly is important for your overall health, especially when you have diabetes (diabetes mellitus).  Exercising has many health benefits, such as increasing muscle strength and bone density and reducing body fat and stress.  Your health care provider or certified diabetes educator can help you make a plan for the type and frequency of exercise (activity plan) that works for you.  When you start a new exercise or activity, work with your health care provider to make sure the activity is safe for you, and to adjust your insulin, medicines, or food intake as needed. This information is not intended to replace advice given to you by your health care provider. Make sure you discuss any questions you have with your health care provider. Document Revised: 11/09/2016 Document Reviewed: 09/27/2015 Elsevier Patient Education  [place]. Diabetes Mellitus and [place] care is an important part of your health, especially when you have diabetes. Diabetes may cause you to have problems because of poor blood flow (circulation) to your feet and legs, which can cause your skin to:  Become thinner and drier.  Break more easily.  Heal more slowly.  Peel and crack. You may also have nerve damage (neuropathy) in your legs and feet, causing decreased feeling in them. This means that you may not notice minor injuries to your feet that could lead to more serious problems. Noticing and addressing any potential problems early is the best way to prevent future foot problems. How to care for your feet Foot hygiene  Wash your feet daily with warm water and mild soap. Do not use hot water. Then, pat your feet and the areas between your toes until they are completely dry. Do not soak your feet as this can dry your skin.  Trim your toenails straight across. Do not dig  under them or around the cuticle. File the edges of your nails with an emery board or nail file.  Apply a moisturizing lotion or petroleum jelly to the skin on your feet and to dry, brittle toenails. Use lotion that does not contain alcohol and is unscented. Do not apply lotion between your toes. Shoes and socks  Wear clean socks or stockings every day. Make sure they are not too tight. Do not wear knee-high stockings since they may decrease blood flow to your legs.  Wear shoes that fit properly and have enough cushioning. Always look in your shoes before you put them on to be sure there are no objects inside.  To break in new shoes, wear them for just a few hours a day. This prevents injuries on your feet. Wounds, scrapes, corns, and calluses  Check your feet daily for blisters, cuts, bruises, sores, and redness. If you cannot see the bottom of your feet, use a mirror or ask someone for help.  Do not cut corns or calluses or try to remove them with medicine.  If you find a minor scrape, cut, or break in the skin on your feet, keep it and the skin around it clean and dry. You may clean these areas  with mild soap and water. Do not clean the area with peroxide, alcohol, or iodine.  If you have a wound, scrape, corn, or callus on your foot, look at it several times a day to make sure it is healing and not infected. Check for: ? Redness, swelling, or pain. ? Fluid or blood. ? Warmth. ? Pus or a bad smell. General instructions  Do not cross your legs. This may decrease blood flow to your feet.  Do not use heating pads or hot water bottles on your feet. They may burn your skin. If you have lost feeling in your feet or legs, you may not know this is happening until it is too late.  Protect your feet from hot and cold by wearing shoes, such as at the beach or on hot pavement.  Schedule a complete foot exam at least once a year (annually) or more often if you have foot problems. If you have  foot problems, report any cuts, sores, or bruises to your health care provider immediately. Contact a health care provider if:  You have a medical condition that increases your risk of infection and you have any cuts, sores, or bruises on your feet.  You have an injury that is not healing.  You have redness on your legs or feet.  You feel burning or tingling in your legs or feet.  You have pain or cramps in your legs and feet.  Your legs or feet are numb.  Your feet always feel cold.  You have pain around a toenail. Get help right away if:  You have a wound, scrape, corn, or callus on your foot and: ? You have pain, swelling, or redness that gets worse. ? You have fluid or blood coming from the wound, scrape, corn, or callus. ? Your wound, scrape, corn, or callus feels warm to the touch. ? You have pus or a bad smell coming from the wound, scrape, corn, or callus. ? You have a fever. ? You have a red line going up your leg. Summary  Check your feet every day for cuts, sores, red spots, swelling, and blisters.  Moisturize feet and legs daily.  Wear shoes that fit properly and have enough cushioning.  If you have foot problems, report any cuts, sores, or bruises to your health care provider immediately.  Schedule a complete foot exam at least once a year (annually) or more often if you have foot problems. This information is not intended to replace advice given to you by your health care provider. Make sure you discuss any questions you have with your health care provider. Document Revised: 01/08/2019 Document Reviewed: 05/19/2016 Elsevier Patient Education  2020 [employer].

## 2019-07-21 NOTE — Progress Notes (Signed)
3 month DM follow-up & review lab results.  AMD

## 2019-07-21 NOTE — Progress Notes (Signed)
Subjective:    Patient ID: Mitchell Whitehead, male    DOB: 23-Jan-1956, 64 y.o.   MRN: [id number]  63y/o caucasian married male here for quarterly diabetes check.  Full labs Sep 2020 with Dr Dorris FetchHendrickson.  Quarterly check with PA Lee 10 Apr 2019 Hgba1c 7.0.  Taking his meds every day.  Diet not as good over holidays.  Working from home.  Has had first covid vaccination and second scheduled with [facility].  Using CPAP.  Still having back pain denied worsening.  Denied foot exam at previous appt.  Stated neck lesion resolved [facility].  Last colonoscopy 2018 diverticulosis ascending colon and internal hemorrhoids.  No further rashes after Jun visit 2020 with Dr Dorris FetchHendrickson for insect/tick bite treated with doxycycline.  Last physical weight 2019 Dr Despina Ariasabinowicz 07/10/2017 218lbs   Dec 2020 227  Today 228 lbs  Patient typically caries his iphone but accidentally forgot at home today.  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 superimposed on annular disc bulge neural foamina encroachment mild and facet joint hypertrophy left typically left, left buttock pain, loose stools/Iris 27 (activia), hypertension  PSHx colonoscopy May 08 and Feb 2019 no polyps and lumbar laminectomy surgery; Rx history ibuprofen 800mg  BID prn; mobic 15mg  2018; xigduo XR 08/998 2 daily 06/2017/one daily 07/2017 dc'd; lisinopril 10mg  06/2017 dc'd; losartan 50mg  daily 06/2017; farxiga 10mg  daily 07/2017; glipizide ER 5mg  daily 07/2017; flomax 0.4mg  2018 cipro 2007; levbid 2008; flexeril 5mg  po TID 2010-2013  FHx- mother age 577 lung cancer and father 2982 died natural causes     Review of Systems  Constitutional: Negative for  activity change, appetite change, chills, diaphoresis, fatigue, fever and unexpected weight change.  HENT: Negative for trouble swallowing and voice change.   Eyes: Negative for photophobia and visual disturbance.  Respiratory: Negative for cough, shortness of breath, wheezing and stridor.   Cardiovascular: Negative for chest pain, palpitations and leg swelling.  Gastrointestinal: Negative for abdominal pain, diarrhea, nausea and vomiting.  Endocrine: Negative for cold intolerance and heat intolerance.  Genitourinary: Negative for difficulty urinating.  Musculoskeletal: Positive for back pain. Negative for arthralgias, gait problem, joint swelling, myalgias, neck pain and neck stiffness.  Skin: Negative for rash.  Allergic/Immunologic: Positive for food allergies. Negative for environmental allergies.  Neurological: Negative for dizziness, tremors, seizures, syncope, facial asymmetry, speech difficulty, weakness, light-headedness, numbness and headaches.  Hematological: Negative for adenopathy. Does not bruise/bleed easily.  Psychiatric/Behavioral: Negative for agitation, confusion and sleep disturbance.       Objective:   Physical Exam Vitals and nursing note reviewed.  Constitutional:      General: He is awake. He is not in acute distress.    Appearance: Normal appearance. He is well-developed, well-groomed and overweight. He is not ill-appearing, toxic-appearing or diaphoretic.  HENT:     Head: Normocephalic and atraumatic.     Jaw: There is normal jaw occlusion.     Salivary Glands: Right salivary gland is not diffusely enlarged or tender. Left salivary gland is not diffusely enlarged or tender.     Right Ear: Hearing and external ear normal.     Left Ear: Hearing and external ear normal.  Nose: Nose normal.     Mouth/Throat:     Lips: Pink. No lesions.     Pharynx: Oropharynx is clear.  Eyes:     General: Lids are normal. Vision grossly intact. Gaze aligned appropriately.  Allergic shiner present. No visual field deficit or scleral icterus.       Right eye: No discharge.        Left eye: No discharge.     Extraocular Movements: Extraocular movements intact.     Right eye: Normal extraocular motion and no nystagmus.     Left eye: Normal extraocular motion and no nystagmus.     Conjunctiva/sclera: Conjunctivae normal.     Pupils: Pupils are equal, round, and reactive to light.  Neck:     Thyroid: No thyromegaly.     Trachea: Trachea and phonation normal.  Cardiovascular:     Rate and Rhythm: Normal rate and regular rhythm.     Pulses: Normal pulses.          Dorsalis pedis pulses are 2+ on the right side and 2+ on the left side.       Posterior tibial pulses are 2+ on the right side and 2+ on the left side.  Pulmonary:     Effort: Pulmonary effort is normal. No respiratory distress.     Breath sounds: Normal breath sounds and air entry. No stridor, decreased air movement or transmitted upper airway sounds. No decreased breath sounds, wheezing, rhonchi or rales.     Comments: No cough observed in exam room; spoke full sentences without difficulty; wearing disposable mask Abdominal:     General: Abdomen is flat.  Musculoskeletal:        General: No swelling, tenderness, deformity or signs of injury. Normal range of motion.     Right shoulder: Normal.     Left shoulder: Normal.     Right elbow: Normal.     Left elbow: Normal.     Right hand: Normal.     Left hand: Normal.     Cervical back: Normal range of motion and neck supple. No edema, erythema, signs of trauma, rigidity, torticollis, tenderness or crepitus. No pain with movement. Normal range of motion.     Right lower leg: No edema.     Left lower leg: No edema.     Right foot: Normal range of motion. No deformity, bunion, Charcot foot, foot drop or prominent metatarsal heads.     Left foot: Normal range of motion. No deformity, bunion, Charcot foot, foot drop or prominent metatarsal heads.  Feet:      Right foot:     Protective Sensation: 6 sites tested. 5 sites sensed.     Skin integrity: Callus and dry skin present. No ulcer, blister, skin breakdown, erythema, warmth or fissure.     Toenail Condition: Right toenails are normal.     Left foot:     Protective Sensation: 6 sites tested. 5 sites sensed.     Skin integrity: Callus and dry skin present. No ulcer, blister, skin breakdown, erythema, warmth or fissure.     Toenail Condition: Left toenails are normal.     Comments: 4th digit not sensed bilaterally; callouses bilateral heels and lateral foot; some dry skin lateral toes and moccasin foot dispersion bilaterally Lymphadenopathy:     Head:     Right side of head: No submental, submandibular, tonsillar or preauricular adenopathy.     Left side of head: No submental, submandibular, tonsillar or preauricular adenopathy.  Cervical: No cervical adenopathy.     Right cervical: No superficial cervical adenopathy.    Left cervical: No superficial cervical adenopathy.  Skin:    General: Skin is warm and dry.     Capillary Refill: Capillary refill takes less than 2 seconds.     Coloration: Skin is not ashen, cyanotic, jaundiced, mottled, pale or sallow.     Findings: Rash present. No abrasion, abscess, acne, bruising, burn, ecchymosis, erythema, signs of injury, laceration, lesion, petechiae or wound. Rash is scaling. Rash is not crusting, macular, nodular, papular, purpuric, pustular, urticarial or vesicular.     Nails: There is no clubbing.     Comments: Bilateral lateral feet and between toes scant  Neurological:     General: No focal deficit present.     Mental Status: He is alert and oriented to person, place, and time. Mental status is at baseline.     GCS: GCS eye subscore is 4. GCS verbal subscore is 5. GCS motor subscore is 6.     Cranial Nerves: Cranial nerves are intact. No cranial nerve deficit, dysarthria or facial asymmetry.     Sensory: Sensation is intact. No sensory  deficit.     Motor: Motor function is intact. No weakness, tremor, atrophy, abnormal muscle tone or seizure activity.     Coordination: Coordination is intact. Coordination normal.     Gait: Gait is intact. Gait normal.     Comments: Gait sure and steady in hallway/clinic; on/off exam table and in/out of chair without difficulty; bilateral hand grasp equal 5/5  Psychiatric:        Attention and Perception: Attention and perception normal.        Mood and Affect: Mood and affect normal.        Speech: Speech normal.        Behavior: Behavior normal. Behavior is cooperative.        Thought Content: Thought content normal.        Cognition and Memory: Cognition and memory normal.        Judgment: Judgment normal.     Normal EKG sinus rhythm 12/23/2018 on epic review  Results for Mitchell Whitehead, Mitchell Whitehead" (MRN [id number]) as of 07/21/2019 15:16  Ref. Range 12/23/2018 09:07  Sodium Latest Ref Range: 134 - 144 mmol/L 137  Potassium Latest Ref Range: 3.5 - 5.2 mmol/L 4.1  Chloride Latest Ref Range: 96 - 106 mmol/L 101  Glucose Latest Ref Range: 65 - 99 mg/dL 167 (H)  BUN Latest Ref Range: 8 - 27 mg/dL 19  Creatinine Latest Ref Range: 0.76 - 1.27 mg/dL 0.79  Calcium Latest Ref Range: 8.6 - 10.2 mg/dL 9.8  BUN/Creatinine Ratio Latest Ref Range: 10 - 24  24  Phosphorus Latest Ref Range: 2.8 - 4.1 mg/dL 3.4  Alkaline Phosphatase Latest Ref Range: 39 - 117 IU/L 61  Albumin Latest Ref Range: 3.8 - 4.8 g/dL 4.3  Albumin/Globulin Ratio Latest Ref Range: 1.2 - 2.2  1.7  Uric Acid Latest Ref Range: 3.7 - 8.6 mg/dL 5.8  AST Latest Ref Range: 0 - 40 IU/L 13  ALT Latest Ref Range: 0 - 44 IU/L 22  Total Protein Latest Ref Range: 6.0 - 8.5 g/dL 6.9  Total Bilirubin Latest Ref Range: 0.0 - 1.2 mg/dL 0.5  GGT Latest Ref Range: 0 - 65 IU/L 54  GFR, Est Non African American Latest Ref Range: >59 mL/min/1.73 96  GFR, Est African American Latest Ref Range: >59 mL/min/1.73 110  Estimated CHD Risk  Latest Ref  Range: 0.0 - 1.0 times avg. 1.3 (H)  LDH Latest Ref Range: 121 - 224 IU/L 144  Total CHOL/HDL Ratio Latest Ref Range: 0.0 - 5.0 ratio 5.9 (H)  Cholesterol, Total Latest Ref Range: 100 - 199 mg/dL 825 (H)  HDL Cholesterol Latest Ref Range: >39 mg/dL 46  LDL (calc) Latest Ref Range: 0 - 99 mg/dL 053 (H)  MICROALB/CREAT RATIO Latest Ref Range: 0 - 29 mg/g creat <17  Triglycerides Latest Ref Range: 0 - 149 mg/dL 976 (H)  VLDL Cholesterol Cal Latest Ref Range: 5 - 40 mg/dL 34  Iron Latest Ref Range: 38 - 169 ug/dL 734  Globulin, Total Latest Ref Range: 1.5 - 4.5 g/dL 2.6  WBC Latest Ref Range: 3.4 - 10.8 x10E3/uL 5.2  RBC Latest Ref Range: 4.14 - 5.80 x10E6/uL 5.43  Hemoglobin Latest Ref Range: 13.0 - 17.7 g/dL 19.3  HCT Latest Ref Range: 37.5 - 51.0 % 47.6  MCV Latest Ref Range: 79 - 97 fL 88  MCH Latest Ref Range: 26.6 - 33.0 pg 29.1  MCHC Latest Ref Range: 31.5 - 35.7 g/dL 79.0  RDW Latest Ref Range: 11.6 - 15.4 % 13.9  Platelets Latest Ref Range: 150 - 450 x10E3/uL 243  Neutrophils Latest Ref Range: Not Estab. % 53  Immature Granulocytes Latest Ref Range: Not Estab. % 0  NEUT# Latest Ref Range: 1.4 - 7.0 x10E3/uL 2.8  Lymphocyte # Latest Ref Range: 0.7 - 3.1 x10E3/uL 1.5  Monocytes Absolute Latest Ref Range: 0.1 - 0.9 x10E3/uL 0.7  Basophils Absolute Latest Ref Range: 0.0 - 0.2 x10E3/uL 0.1  Immature Grans (Abs) Latest Ref Range: 0.0 - 0.1 x10E3/uL 0.0  Lymphs Latest Ref Range: Not Estab. % 29  Monocytes Latest Ref Range: Not Estab. % 13  Basos Latest Ref Range: Not Estab. % 1  Eos Latest Ref Range: Not Estab. % 4  EOS (ABSOLUTE) Latest Ref Range: 0.0 - 0.4 x10E3/uL 0.2  Hemoglobin A1C Latest Ref Range: 4.8 - 5.6 % 7.1 (H)  TSH Latest Ref Range: 0.450 - 4.500 uIU/mL 1.650  Thyroxine (T4) Latest Ref Range: 4.5 - 12.0 ug/dL 5.8  Free Thyroxine Index Latest Ref Range: 1.2 - 4.9  1.6  T3 Uptake Ratio Latest Ref Range: 24 - 39 % 28  Prostate Specific Ag, Serum Latest Ref Range: 0.0 -  4.0 ng/mL 0.3  Results for Mitchell Whitehead, Mitchell Whitehead" (MRN [id number]) as of 07/21/2019 15:16  Ref. Range 07/15/2019 08:14  BUN Latest Ref Range: 8 - 27 mg/dL 15  Creatinine Latest Ref Range: 0.76 - 1.27 mg/dL 9.92  BUN/Creatinine Ratio Latest Ref Range: 10 - 24  18  GFR, Est Non African American Latest Ref Range: >59 mL/min/1.73 93  GFR, Est African American Latest Ref Range: >59 mL/min/1.73 108  Hemoglobin A1C Latest Ref Range: 4.8 - 5.6 % 8.1 (H)  Results for Mitchell Whitehead, Mitchell Whitehead" (MRN [id number]) as of 07/21/2019 15:17  Ref. Range 12/23/2018 09:07 12/23/2018 09:12  Bilirubin, UA Unknown  Negative  Clarity, UA Unknown  Clear  Color, UA Unknown  Yellow  Glucose Latest Ref Range: Negative   Positive (A)  Ketones, UA Unknown  Negative  Leukocytes,UA Latest Ref Range: Negative   Negative  Nitrite, UA Unknown  Negative  pH, UA Latest Ref Range: 5.0 - 8.0   6.0  Protein,UA Latest Ref Range: Negative   Negative  Specific Gravity, UA Latest Ref Range: 1.010 - 1.025   1.010  Urobilinogen, UA Latest Ref Range: 0.2 or  1.0 E.U./dL  0.2  RBC, UA Unknown  Negative  Microalbumin, Urine Latest Ref Range: Not Estab. ug/mL <3.0   MICROALB/CREAT RATIO Latest Ref Range: 0 - 29 mg/g creat <17   Creatinine, Urine Latest Ref Range: Not Estab. mg/dL 16.1    Labs [id number] paper chart  Hgba1c 7-11.6 Total cholesterol 214-263 Trigs 123-252 HDL 30-49 LDL 136-189 PSA 9.8-1.1    Assessment & Plan:  A-type II diabetes, BMI 29, tinea pedis bilaterally, hypertension, hyperlipidemia, chronic back pain, osa  P-Follow up in 3 months repeat HgbA1c due ordered.  Had microalbumin done Sep 2020 discussed with patient due again Sep 2021. Congratulated patient on taking steps to improve with HgbA1c over the past two years went from 11.6 to 7.0 Dec 2020 and worsened over the holidays and he is motivated to improve diet/activity as retirement Apr 2021 planning to go fishing.  Reported he is taking medication every day and doesn't  need refills at this time.  Reviewed epic refills due Sep 2021.  Encouraged weight loss over the next year but emphasized healthy diet and activity daily main goals.  Continue Farxiga  po daily and glipizide  po daily  Patient given copy of labs results from Mar 2021 and reviewed results with him normal kidney function but elevated glucose.  Hgba1c in 3 months along with follow up visit.   Ensure staying hydrated drink water to keep urine pale yellow clear and voiding every 4 hours when awake.  Avoid dehydration. Patient reported due for optometry schedule routine exam annually due to hypertension and diabetes.  Recently had dental appt/cleaning.  Showed patient health app on iphone and where to find his steps for previous year average.  Discussed increasing steps 10% per week goal to get 150 minutes per week and by 1 year 91478 steps per day.  Discussed do not have to do long duration but 5 minutes per hour adds up throughout the day/parking end of parking lots/taking stretch break at home/work every hour for quick walk up and down hallway to give cardiovascular benefits.  Consider carrying phone/using free app on phone to track steps.  Patient refused dietitian/nutritional consult at this time. Avoid added sugars in diet/non-nutritional greater than 150 calories per day.  Ts on diabetes exercise and foot care printed and given to patient.  Notify optometry provider diabetes diagnosis.  Also notify dentist of diabetes diagnosis as more frequent cleanings/services may be covered by insurance with diabetes.  Encouraged weight loss.  Apply emollient to feet each night e.g. vaseline/aquaphor/eucerin (squeeze or scoop container not pump as pump has alcohol in it and more drying).  Do visual foot inspection every day when putting on socks/shoes check for redness/sores.  Discussed high blood sugars can affect nerve function/altered sensation and may not noticed hot spots (red areas)/blisters or cuts especially  between toes.  Patient verbalized understanding information/instructions, agreed with plan of care and had no further questions at this time.  Rotate shoes best to have two pairs so one can dry out thoroughly before rewearing consider putting in sunlight after loosening laces to dry.  Consider athlete's foot powder in shoes and using pumice stone on feet to remove callouses.  Discussed perform foot exam daily when putting on shoes/socks.  Very mild athletes foot.  Reoccurrence of condition common and may require retreatment.  Patient verbalized agreement and understanding of treatment plan and had no further questions at this time. P2: Avoidance and hand washing.  BMI 29 encouraged weight loss to BMI closer  to 25; increase steps to goal 28315 per day over the next year. Patient verbalized understanding information/instructions and had no further questions at this time.  Continue atorvastatin 10mg  po daily.  LFTs/lipids due Sep 2021 with annual physical. Discussed weight loss and increase activity.   Patient agreed with plan of care and verbalized understanding of information/instructions and had no further questions at this time.   Continue current medications as directed. Losartan 50mg  po daily  BP improved from last visit Dec 2020 goal 120s/70s.   Consider DASH diet and exercise program 150 minutes exercise/activity per week.  Recommended weight loss/weight maintenance to BMI 20-25. Return to the clinic if any new symptoms/notify clinic staff if visual changes, frequent headache, chest pain or dyspnea on mild or  minimal exertion.  Patient verbalized agreement and understanding of treatment plan and had no further questions at this time. P2: Diet and Exercise specific for HTN  Back pain chronic.  Stretching/exercises/avoid weight gain.  NSAID OTC prn pain.  Follow up if new or worsening symptoms.  Patient verbalized understanding information/instructions, agreed with plan of care and had no further  questions at this time.  Encouraged nightly cpap use  Patient verbalized understanding and had no further questions at this time.

## 2019-08-20 ENCOUNTER — Ambulatory Visit (INDEPENDENT_AMBULATORY_CARE_PROVIDER_SITE_OTHER): Payer: 59

## 2019-08-20 ENCOUNTER — Encounter: Payer: Self-pay | Admitting: Podiatry

## 2019-08-20 ENCOUNTER — Other Ambulatory Visit: Payer: Self-pay

## 2019-08-20 ENCOUNTER — Ambulatory Visit (INDEPENDENT_AMBULATORY_CARE_PROVIDER_SITE_OTHER): Payer: 59 | Admitting: Podiatry

## 2019-08-20 DIAGNOSIS — M722 Plantar fascial fibromatosis: Secondary | ICD-10-CM | POA: Diagnosis not present

## 2019-08-20 MED ORDER — MELOXICAM 15 MG PO TABS: 15.0000 mg | ORAL_TABLET | Freq: Every day | ORAL | 3 refills | Status: DC

## 2019-08-20 MED ORDER — METHYLPREDNISOLONE 4 MG PO TBPK: ORAL_TABLET | ORAL | 0 refills | Status: DC

## 2019-08-20 NOTE — Patient Instructions (Signed)

## 2019-08-20 NOTE — Progress Notes (Signed)
Subjective:  Patient ID: Mitchell Whitehead, male    DOB: 03-Jan-1956,  MRN: [id number] HPI Chief Complaint  Patient presents with  . Foot Pain    Patient presents today for pain in right heel x 3 weeks.  "it feels like a rock or bruise when I walk and sometimes have sharp pains when I mostly walk.  The pain sometimes goes up the back of my heel"  He has used Ibuprofen and ice packs with not much relief    64 y.o. male presents with the above complaint.   ROS: Denies fever chills nausea vomiting muscle aches pains calf pain back pain chest pain shortness of breath.  Past Medical History:  Diagnosis Date  . Degenerative disc disease, lumbar   . Diabetes mellitus without complication ([place])   . History of prostatitis   . Hypertension   . Impingement syndrome of left shoulder   . Left buttock pain   . Sleep apnea    doesnt use cpap   Past Surgical History:  Procedure Laterality Date  . BACK SURGERY    . COLONOSCOPY    . COLONOSCOPY WITH PROPOFOL N/A 06/15/2017   Procedure: COLONOSCOPY WITH PROPOFOL;  Surgeon: Mitchell Silvas, MD;  Location: [facility] ENDOSCOPY;  Service: Endoscopy;  Laterality: N/A;  . WISDOM TOOTH EXTRACTION      Current Outpatient Medications:  .  atorvastatin (LIPITOR) 10 MG tablet, Take 1 tablet (10 mg total) by mouth daily., Disp: 90 tablet, Rfl: 3 .  FARXIGA 10 MG TABS tablet, TK 1 T PO QD, Disp: , Rfl:  .  glipiZIDE (GLUCOTROL XL) 5 MG 24 hr tablet, TK 1 T PO QD, Disp: , Rfl:  .  ibuprofen (ADVIL,MOTRIN) 200 MG tablet, Take 200 mg by mouth every 6 (six) hours as needed for moderate pain., Disp: , Rfl:  .  losartan (COZAAR) 50 MG tablet, TK 1 T PO QD, Disp: , Rfl:  .  meloxicam (MOBIC) 15 MG tablet, Take 1 tablet (15 mg total) by mouth daily., Disp: 30 tablet, Rfl: 3 .  methylPREDNISolone (MEDROL DOSEPAK) 4 MG TBPK tablet, 6 day dose pack - take as directed, Disp: 21 tablet, Rfl: 0  Allergies  Allergen Reactions  . Grapeseed Extract [Nutritional Supplements]     Review of Systems Objective:  There were no vitals filed for this visit.  General: Well developed, nourished, in no acute distress, alert and oriented x3   Dermatological: Skin is warm, dry and supple bilateral. Nails x 10 are well maintained; remaining integument appears unremarkable at this time. There are no open sores, no preulcerative lesions, no rash or signs of infection present.  Vascular: Dorsalis Pedis artery and Posterior Tibial artery pedal pulses are 2/4 bilateral with immedate capillary fill time. Pedal hair growth present. No varicosities and no lower extremity edema present bilateral.   Neruologic: Grossly intact via light touch bilateral. Vibratory intact via tuning fork bilateral. Protective threshold with Semmes Wienstein monofilament intact to all pedal sites bilateral. Patellar and Achilles deep tendon reflexes 2+ bilateral. No Babinski or clonus noted bilateral.   Musculoskeletal: No gross boney pedal deformities bilateral. No pain, crepitus, or limitation noted with foot and ankle range of motion bilateral. Muscular strength 5/5 in all groups tested bilateral.  Pain on palpation medial calcaneal tubercle.  Gait: Unassisted, Nonantalgic.    Radiographs:  Radiographs taken today demonstrate soft tissue increase in density plantar fifth calcaneal insertion site of the right heel.  Small spurs noted.  No other significant abnormalities  are identified.  Assessment & Plan:   Assessment: Plantar fasciitis right.  Plan: Discussed etiology pathology conservative versus surgical therapies at this point I injected the right heel with 20 mg Kenalog 5 mg Marcaine.  Tolerated procedure well without complications.  Also placed him in a plantar fascial brace and a night splint.  Started him on meloxicam.  Discussed appropriate shoe gear stretching exercises ice therapy shoe gear modifications follow-up with him in 1 month     Mitchell Wymore T. [place], [place]

## 2019-09-22 ENCOUNTER — Encounter: Payer: 59 | Admitting: Podiatry

## 2019-09-22 ENCOUNTER — Other Ambulatory Visit: Payer: Self-pay

## 2019-09-22 DIAGNOSIS — E119 Type 2 diabetes mellitus without complications: Secondary | ICD-10-CM

## 2019-09-23 MED ORDER — GLIPIZIDE ER 5 MG PO TB24: 5.0000 mg | ORAL_TABLET | Freq: Every day | ORAL | 3 refills | Status: DC

## 2019-10-13 ENCOUNTER — Other Ambulatory Visit: Payer: Self-pay

## 2019-10-13 MED ORDER — FARXIGA 10 MG PO TABS: 10.0000 mg | ORAL_TABLET | Freq: Every day | ORAL | 2 refills | Status: DC

## 2019-11-05 ENCOUNTER — Ambulatory Visit: Payer: Self-pay

## 2019-11-07 ENCOUNTER — Ambulatory Visit: Payer: Self-pay | Admitting: Physician Assistant

## 2019-11-07 ENCOUNTER — Ambulatory Visit: Payer: Self-pay

## 2019-11-07 ENCOUNTER — Other Ambulatory Visit: Payer: Self-pay

## 2019-11-07 ENCOUNTER — Encounter: Payer: Self-pay | Admitting: Physician Assistant

## 2019-11-07 VITALS — BP 133/72 | HR 72 | Temp 98.2°F | Resp 12 | Ht 73.5 in | Wt 223.0 lb

## 2019-11-07 DIAGNOSIS — R109 Unspecified abdominal pain: Secondary | ICD-10-CM

## 2019-11-07 DIAGNOSIS — E119 Type 2 diabetes mellitus without complications: Secondary | ICD-10-CM

## 2019-11-07 LAB — POCT GLYCOSYLATED HEMOGLOBIN (HGB A1C): Hemoglobin A1C: 7.5 % — AB (ref 4.0–5.6)

## 2019-11-07 MED ORDER — CYCLOBENZAPRINE HCL 10 MG PO TABS: 10.0000 mg | ORAL_TABLET | Freq: Three times a day (TID) | ORAL | 0 refills | Status: DC | PRN

## 2019-11-07 NOTE — Progress Notes (Signed)
5 lb weight loss since 07/21/19 visit.  C/O a lot swelling in feet & ankles.  Outside in the heat fishing - retired from [employer] 08/30/19.  AMD

## 2019-11-07 NOTE — Progress Notes (Signed)
   Subjective: Diabetes    Patient ID: Mitchell Whitehead, male    DOB: Nov 11, 1955, 64 y.o.   MRN: [id number]  HPI Patient presents with 26-month follow-up for diabetes.  Patient has been a 5 pound weight loss.  Previous hemoglobin A1c was 8.1.  Hemoglobin A1c today is 7.5.  Patient  complained intermitting edema to the lower extremities.  Patient states this occurs whenever he goes fishing.  Patient denies dyspnea or chest pain.  Patient also state intimating right lateral back pain.  Patient denies dysuria or hematuria.  Patient describes his back pain as "spasmatic".  Mild relief taking meloxicam.   Review of Systems Diabetes, hyperlipidemia, and hypertension.    Objective:   Physical Exam HEENT is unremarkable.  Neck supple without adenopathy or bruits.  Lungs are clear to auscultation.  Heart regular rate and rhythm.  No obvious edema to the lower extremities at this time.  Peripheral pulses are intact.  No obvious lumbar spine deformity.  Patient has moderate guarding and spasms of the right paraspinal muscle area.       Assessment & Plan: Diabetes  Patient is getting better control of his diabetes.  Advised continue lifestyle changes to include weight loss and exercise.  Continue previous medications and follow-up in 6 months.  Patient given a prescription for Flexeril to take on a as needed basis.

## 2019-12-04 ENCOUNTER — Other Ambulatory Visit: Payer: Self-pay | Admitting: Podiatry

## 2019-12-22 ENCOUNTER — Other Ambulatory Visit: Payer: Self-pay

## 2019-12-22 DIAGNOSIS — I10 Essential (primary) hypertension: Secondary | ICD-10-CM

## 2019-12-22 MED ORDER — LOSARTAN POTASSIUM 50 MG PO TABS: 50.0000 mg | ORAL_TABLET | Freq: Every day | ORAL | 3 refills | Status: DC

## 2020-02-20 NOTE — Progress Notes (Signed)
Scheduled to complete physical 03/02/20. CL,RMA

## 2020-02-23 ENCOUNTER — Ambulatory Visit: Payer: Self-pay

## 2020-02-23 ENCOUNTER — Other Ambulatory Visit: Payer: Self-pay

## 2020-02-23 DIAGNOSIS — Z0000 Encounter for general adult medical examination without abnormal findings: Secondary | ICD-10-CM

## 2020-02-23 LAB — POCT URINALYSIS DIPSTICK
Bilirubin, UA: NEGATIVE
Blood, UA: NEGATIVE
Glucose, UA: POSITIVE — AB
Leukocytes, UA: NEGATIVE
Nitrite, UA: NEGATIVE
Protein, UA: NEGATIVE
Spec Grav, UA: 1.02 (ref 1.010–1.025)
Urobilinogen, UA: 0.2 E.U./dL
pH, UA: 5.5 (ref 5.0–8.0)

## 2020-02-24 LAB — MICROALBUMIN / CREATININE URINE RATIO
Creatinine, Urine: 62.1 mg/dL
Microalb/Creat Ratio: 11 mg/g creat (ref 0–29)
Microalbumin, Urine: 6.9 ug/mL

## 2020-02-24 LAB — CMP12+LP+TP+TSH+6AC+PSA+CBC…
ALT: 28 IU/L (ref 0–44)
AST: 22 IU/L (ref 0–40)
Albumin/Globulin Ratio: 1.7 (ref 1.2–2.2)
Albumin: 4.3 g/dL (ref 3.8–4.8)
Alkaline Phosphatase: 74 IU/L (ref 44–121)
BUN/Creatinine Ratio: 23 (ref 10–24)
BUN: 20 mg/dL (ref 8–27)
Basophils Absolute: 0.1 10*3/uL (ref 0.0–0.2)
Basos: 1 %
Bilirubin Total: 0.5 mg/dL (ref 0.0–1.2)
Calcium: 9.5 mg/dL (ref 8.6–10.2)
Chloride: 100 mmol/L (ref 96–106)
Chol/HDL Ratio: 5.4 ratio — ABNORMAL HIGH (ref 0.0–5.0)
Cholesterol, Total: 200 mg/dL — ABNORMAL HIGH (ref 100–199)
Creatinine, Ser: 0.87 mg/dL (ref 0.76–1.27)
EOS (ABSOLUTE): 0.3 10*3/uL (ref 0.0–0.4)
Eos: 5 %
Estimated CHD Risk: 1.1 times avg. — ABNORMAL HIGH (ref 0.0–1.0)
Free Thyroxine Index: 1.6 (ref 1.2–4.9)
GFR calc Af Amer: 105 mL/min/{1.73_m2} (ref >59)
GFR calc non Af Amer: 91 mL/min/{1.73_m2} (ref >59)
GGT: 51 IU/L (ref 0–65)
Globulin, Total: 2.6 g/dL (ref 1.5–4.5)
Glucose: 173 mg/dL — ABNORMAL HIGH (ref 65–99)
HDL: 37 mg/dL — ABNORMAL LOW (ref >39)
Hematocrit: 48.2 % (ref 37.5–51.0)
Hemoglobin: 16 g/dL (ref 13.0–17.7)
Immature Grans (Abs): 0.1 10*3/uL (ref 0.0–0.1)
Immature Granulocytes: 1 %
Iron: 133 ug/dL (ref 38–169)
LDH: 155 IU/L (ref 121–224)
LDL Chol Calc (NIH): 135 mg/dL — ABNORMAL HIGH (ref 0–99)
Lymphocytes Absolute: 2.2 10*3/uL (ref 0.7–3.1)
Lymphs: 37 %
MCH: 28.1 pg (ref 26.6–33.0)
MCHC: 33.2 g/dL (ref 31.5–35.7)
MCV: 85 fL (ref 79–97)
Monocytes Absolute: 0.9 10*3/uL (ref 0.1–0.9)
Monocytes: 14 %
Neutrophils Absolute: 2.5 10*3/uL (ref 1.4–7.0)
Neutrophils: 42 %
Phosphorus: 2.7 mg/dL — ABNORMAL LOW (ref 2.8–4.1)
Platelets: 265 10*3/uL (ref 150–450)
Potassium: 4.7 mmol/L (ref 3.5–5.2)
Prostate Specific Ag, Serum: 0.3 ng/mL (ref 0.0–4.0)
RBC: 5.7 x10E6/uL (ref 4.14–5.80)
RDW: 13 % (ref 11.6–15.4)
Sodium: 139 mmol/L (ref 134–144)
T3 Uptake Ratio: 26 % (ref 24–39)
T4, Total: 6.2 ug/dL (ref 4.5–12.0)
TSH: 3.13 u[IU]/mL (ref 0.450–4.500)
Total Protein: 6.9 g/dL (ref 6.0–8.5)
Triglycerides: 157 mg/dL — ABNORMAL HIGH (ref 0–149)
Uric Acid: 6.3 mg/dL (ref 3.8–8.4)
VLDL Cholesterol Cal: 28 mg/dL (ref 5–40)
WBC: 6 10*3/uL (ref 3.4–10.8)

## 2020-02-24 LAB — HGB A1C W/O EAG: Hgb A1c MFr Bld: 8.1 % — ABNORMAL HIGH (ref 4.8–5.6)

## 2020-03-02 ENCOUNTER — Other Ambulatory Visit: Payer: Self-pay

## 2020-03-02 ENCOUNTER — Encounter: Payer: Self-pay | Admitting: Nurse Practitioner

## 2020-03-02 ENCOUNTER — Ambulatory Visit: Payer: Self-pay | Admitting: Nurse Practitioner

## 2020-03-02 VITALS — BP 150/86 | HR 69 | Temp 98.5°F | Resp 14 | Ht 73.0 in | Wt 232.0 lb

## 2020-03-02 DIAGNOSIS — Z0000 Encounter for general adult medical examination without abnormal findings: Secondary | ICD-10-CM

## 2020-03-02 NOTE — Progress Notes (Signed)
Pt presents today to complete physical with Sarah Parker, FNP.  CL,RMA 

## 2020-03-02 NOTE — Progress Notes (Signed)
Subjective:    Patient ID: Mitchell Whitehead, male    DOB: 06/05/1955, 64 y.o.   MRN: [id number]  HPI  64 year old male here for annual physical. History as follows:  Past Medical History:  Diagnosis Date  . Degenerative disc disease, lumbar   . Diabetes mellitus without complication ([place])   . History of prostatitis   . Hypertension   . Impingement syndrome of left shoulder   . Left buttock pain   . Sleep apnea    doesnt use cpap   Today he has complaints of some decreased sensation in his left calf. He noticed he did not feel heat on it within the past month, denies any pain.   He is now retired. Does not exercise regularly, still not using CPAP.   He is compliant with medications.   History of smoking quit at age 75- had Chest Xray completed as part of pre op workup in 2010 was WNL.   Colonoscopy 2019- diverticulum ascending colon and internal hemorrhoids no recommendations next colonoscopy 2029.     Had [employer] booster one month ago (now has 3 vaccines)   He does note he has had some recent sinus irritation has Flonase but does not use it regularly.   Take ibuprofen 650m on average once daily for generalized body pains.   Back pain has at this point resolved.   Sees eye doctor once annually recent had exam and received new eye glasses.   Sees dentist twice annually.   Today's Vitals   03/02/20 1025  BP: (!) 150/86  Pulse: 69  Resp: 14  Temp: 98.5 F (36.9 C)  SpO2: 97%  Weight: 232 lb (105.2 kg)  Height: 6' 1"  (1.854 m)   Body mass index is 30.61 kg/m. Review of Systems  Constitutional: Negative.   HENT: Positive for congestion.   Eyes: Negative.   Respiratory: Negative.   Cardiovascular: Negative.   Gastrointestinal: Negative.   Endocrine: Negative.   Genitourinary: Negative.   Musculoskeletal: Positive for arthralgias.  Skin: Negative.   Neurological: Negative.   Hematological: Negative.   Psychiatric/Behavioral: Negative.    Past  Surgical History:  Procedure Laterality Date  . BACK SURGERY    . COLONOSCOPY    . COLONOSCOPY WITH PROPOFOL N/A 06/15/2017   Procedure: COLONOSCOPY WITH PROPOFOL;  Surgeon: EManya Silvas MD;  [facility];  Service: Endoscopy;  Laterality: N/A;  . WISDOM TOOTH EXTRACTION     Family History  Problem Relation Age of Onset  . Lung cancer Mother       Objective:   Physical Exam Constitutional:      Appearance: Normal appearance.  HENT:     Head: Normocephalic.     Right Ear: Tympanic membrane, ear canal and external ear normal.     Left Ear: Tympanic membrane, ear canal and external ear normal.     Nose: Nose normal.     Mouth/Throat:     Mouth: Mucous membranes are moist.     Pharynx: Oropharynx is clear.  Eyes:     Extraocular Movements: Extraocular movements intact.     Conjunctiva/sclera: Conjunctivae normal.     Pupils: Pupils are equal, round, and reactive to light.  Cardiovascular:     Rate and Rhythm: Normal rate and regular rhythm.     Pulses: Normal pulses.          Radial pulses are 2+ on the right side and 2+ on the left side.  Dorsalis pedis pulses are 2+ on the right side and 2+ on the left side.       Posterior tibial pulses are 2+ on the right side and 2+ on the left side.     Heart sounds: Normal heart sounds, S1 normal and S2 normal. No murmur heard.   Musculoskeletal:     Cervical back: Normal range of motion and neck supple.     Right lower leg: No edema.     Left lower leg: No edema.  Feet:     Right foot:     Protective Sensation: 6 sites tested. 6 sites sensed.     Skin integrity: Skin integrity normal.     Left foot:     Protective Sensation: 6 sites tested. 6 sites sensed.     Skin integrity: Skin integrity normal.  Skin:    General: Skin is warm and dry.  Neurological:     General: No focal deficit present.     Mental Status: He is alert and oriented to person, place, and time.     Cranial Nerves: No cranial nerve  deficit.     Sensory: Sensation is intact.     Motor: Motor function is intact.     Coordination: Coordination is intact.     Gait: Gait is intact.  Psychiatric:        Mood and Affect: Mood normal.        Behavior: Behavior normal.        Thought Content: Thought content normal.        Judgment: Judgment normal.    Recent Results (from the past 2160 hour(s))  CMP12+LP+TP+TSH+6AC+PSA+CBC.     Status: Abnormal   Collection Time: 02/23/20  9:09 AM  Result Value Ref Range   Glucose 173 (H) 65 - 99 mg/dL   Uric Acid 6.3 3.8 - 8.4 mg/dL    Comment:            Therapeutic target for gout patients: <6.0   BUN 20 8 - 27 mg/dL   Creatinine, Ser 0.87 0.76 - 1.27 mg/dL   GFR calc non Af Amer 91 >59 mL/min/1.73   GFR calc Af Amer 105 >59 mL/min/1.73    Comment: **In accordance with recommendations from the NKF-ASN Task force,**   [facility] is in the process of updating its eGFR calculation to the   2021 CKD-EPI creatinine equation that estimates kidney function   without a race variable.    BUN/Creatinine Ratio 23 10 - 24   Sodium 139 134 - 144 mmol/L   Potassium 4.7 3.5 - 5.2 mmol/L   Chloride 100 96 - 106 mmol/L   Calcium 9.5 8.6 - 10.2 mg/dL   Phosphorus 2.7 (L) 2.8 - 4.1 mg/dL   Total Protein 6.9 6.0 - 8.5 g/dL   Albumin 4.3 3.8 - 4.8 g/dL   Globulin, Total 2.6 1.5 - 4.5 g/dL   Albumin/Globulin Ratio 1.7 1.2 - 2.2   Bilirubin Total 0.5 0.0 - 1.2 mg/dL   Alkaline Phosphatase 74 44 - 121 IU/L    Comment:               **Please note reference interval change**   LDH 155 121 - 224 IU/L   AST 22 0 - 40 IU/L   ALT 28 0 - 44 IU/L   GGT 51 0 - 65 IU/L   Iron 133 38 - 169 ug/dL   Cholesterol, Total 200 (H) 100 - 199 mg/dL   Triglycerides 157 (H)  0 - 149 mg/dL   HDL 37 (L) >39 mg/dL   VLDL Cholesterol Cal 28 5 - 40 mg/dL   LDL Chol Calc (NIH) 135 (H) 0 - 99 mg/dL   Chol/HDL Ratio 5.4 (H) 0.0 - 5.0 ratio    Comment:                                   T. Chol/HDL Ratio                                              Men  Women                               1/2 Avg.Risk  3.4    3.3                                   Avg.Risk  5.0    4.4                                2X Avg.Risk  9.6    7.1                                3X Avg.Risk 23.4   11.0    Estimated CHD Risk 1.1 (H) 0.0 - 1.0 times avg.    Comment: The CHD Risk is based on the T. Chol/HDL ratio. Other factors affect CHD Risk such as hypertension, smoking, diabetes, severe obesity, and family history of premature CHD.    TSH 3.130 0.450 - 4.500 uIU/mL   T4, Total 6.2 4.5 - 12.0 ug/dL   T3 Uptake Ratio 26 24 - 39 %   Free Thyroxine Index 1.6 1.2 - 4.9   Prostate Specific Ag, Serum 0.3 0.0 - 4.0 ng/mL    Comment: Roche ECLIA methodology. According to the American Urological Association, Serum PSA should decrease and remain at undetectable levels after radical prostatectomy. The AUA defines biochemical recurrence as an initial PSA value 0.2 ng/mL or greater followed by a subsequent confirmatory PSA value 0.2 ng/mL or greater. Values obtained with different assay methods or kits cannot be used interchangeably. Results cannot be interpreted as absolute evidence of the presence or absence of malignant disease.    WBC 6.0 3.4 - 10.8 x10E3/uL   RBC 5.70 4.14 - 5.80 x10E6/uL   Hemoglobin 16.0 13.0 - 17.7 g/dL   Hematocrit 48.2 37.5 - 51.0 %   MCV 85 79 - 97 fL   MCH 28.1 26.6 - 33.0 pg   MCHC 33.2 31 - 35 g/dL   RDW 13.0 11.6 - 15.4 %   Platelets 265 150 - 450 x10E3/uL   Neutrophils 42 Not Estab. %   Lymphs 37 Not Estab. %   Monocytes 14 Not Estab. %   Eos 5 Not Estab. %   Basos 1 Not Estab. %   Neutrophils Absolute 2.5 1.40 - 7.00 x10E3/uL   Lymphocytes Absolute 2.2 0 - 3 x10E3/uL   Monocytes Absolute 0.9 0 - 0 x10E3/uL   EOS (ABSOLUTE) 0.3 0.0 - 0.4  x10E3/uL   Basophils Absolute 0.1 0 - 0 x10E3/uL   Immature Granulocytes 1 Not Estab. %   Immature Grans (Abs) 0.1 0.0 - 0.1 x10E3/uL  Microalbumin / creatinine  urine ratio     Status: None   Collection Time: 02/23/20  9:09 AM  Result Value Ref Range   Creatinine, Urine 62.1 Not Estab. mg/dL   Microalbumin, Urine 6.9 Not Estab. ug/mL   Microalb/Creat Ratio 11 0 - 29 mg/g creat    Comment:                        Normal:                0 -  29                        Moderately increased: 30 - 300                        Severely increased:       >300   Hgb A1c w/o eAG     Status: Abnormal   Collection Time: 02/23/20  9:09 AM  Result Value Ref Range   Hgb A1c MFr Bld 8.1 (H) 4.8 - 5.6 %    Comment:          Prediabetes: 5.7 - 6.4          Diabetes: >6.4          Glycemic control for adults with diabetes: <7.0   POCT urinalysis dipstick     Status: Abnormal   Collection Time: 02/23/20 10:19 AM  Result Value Ref Range   Color, UA yellow    Clarity, UA clear    Glucose, UA Positive (A) Negative    Comment: 3+   Bilirubin, UA negative    Ketones, UA 2+    Spec Grav, UA 1.020 1.010 - 1.025   Blood, UA negative    pH, UA 5.5 5.0 - 8.0   Protein, UA Negative Negative   Urobilinogen, UA 0.2 0.2 or 1.0 E.U./dL   Nitrite, UA negative    Leukocytes, UA Negative Negative   Appearance light    Odor      CV Risk 36.8%     Assessment & Plan:  Total Cholesterol decreasing (from 272 now 200)(LdL not recorded at previous visit now at 135)  A1C increased from 7.5 to 8.1 (after previosu 3 month decrease).   Glucose continues to be present in urinalysis   EKG reviewed with patient including recent changed from previous EKG. Recommended referral to Cardiology given CV risk factor, history of DM, HTN, Hyperlipidemia and changes to EKG. Patient refused at this time. May reconsider when he is enrolled in Medicare at 56 (July 2022). Current concern over health costs.   Discussed lifestyle modification for improving CV risk, diet and exercise. He is motivated to lose weight and start walking. Discussed forms of sugar/carbs in diet and what he can work to  eliminate/decrease.   Discussed CV risk factors increasing and what that means including increased risk for stroke or heart attack.   Does not use CPAP, feels very congested at night when trying to sleep. Encouraged CPAP use, may also try breath right strips and Flonase regularly to help with congestion. May consider repeat Sleep study and new machine when on Medicare.   RTC in 3 months for repeat A1C. Encouraged conscious diet over holiday season and  may need adjustment in medications if A1C continues to increase.    Also provided patient with BP monitoring card and encouraged to measure BP at home over the next 3 months to evaluate average and if BP is being managed efficiently.   RTC earlier with any new concerns.   Continue to monitor feet and posterior circulation as discussed.

## 2020-06-02 ENCOUNTER — Ambulatory Visit: Payer: 59

## 2020-06-22 ENCOUNTER — Ambulatory Visit: Payer: 59 | Admitting: Physician Assistant

## 2020-06-22 DIAGNOSIS — E119 Type 2 diabetes mellitus without complications: Secondary | ICD-10-CM

## 2020-07-09 ENCOUNTER — Other Ambulatory Visit: Payer: Self-pay

## 2020-07-09 DIAGNOSIS — E7849 Other hyperlipidemia: Secondary | ICD-10-CM

## 2020-07-09 MED ORDER — ATORVASTATIN CALCIUM 10 MG PO TABS: 10.0000 mg | ORAL_TABLET | Freq: Every day | ORAL | 0 refills | Status: DC

## 2020-07-16 ENCOUNTER — Other Ambulatory Visit: Payer: Self-pay

## 2020-07-16 DIAGNOSIS — E119 Type 2 diabetes mellitus without complications: Secondary | ICD-10-CM

## 2020-07-16 MED ORDER — FARXIGA 10 MG PO TABS: 10.0000 mg | ORAL_TABLET | Freq: Every day | ORAL | 0 refills | Status: DC

## 2020-07-21 ENCOUNTER — Other Ambulatory Visit: Payer: Self-pay

## 2020-07-21 DIAGNOSIS — E119 Type 2 diabetes mellitus without complications: Secondary | ICD-10-CM

## 2020-07-21 MED ORDER — FARXIGA 10 MG PO TABS: 10.0000 mg | ORAL_TABLET | Freq: Every day | ORAL | 0 refills | Status: DC

## 2020-07-28 ENCOUNTER — Encounter: Payer: Self-pay | Admitting: Physician Assistant

## 2020-07-28 ENCOUNTER — Other Ambulatory Visit: Payer: Self-pay

## 2020-07-28 ENCOUNTER — Ambulatory Visit: Payer: Self-pay | Admitting: Physician Assistant

## 2020-07-28 VITALS — BP 142/93 | HR 89 | Temp 98.6°F | Resp 12 | Ht 74.0 in | Wt 226.0 lb

## 2020-07-28 DIAGNOSIS — E119 Type 2 diabetes mellitus without complications: Secondary | ICD-10-CM

## 2020-07-28 LAB — POCT GLYCOSYLATED HEMOGLOBIN (HGB A1C): Hemoglobin A1C: 7 % — AB (ref 4.0–5.6)

## 2020-07-28 NOTE — Progress Notes (Signed)
Went to ENT recently & was on multi-week prednisone.  Been about 3 weeks that he's been off of it.  Needs Rx for Glucose strips.  AMD

## 2020-07-28 NOTE — Progress Notes (Signed)
   Subjective: Diabetes    Patient ID: Mitchell Whitehead, male    DOB: 07/07/1955, 65 y.o.   MRN: [id number]  HPI  Patient presents for evaluation of diabetes.  Patient history of poor compliance with medications.  Patient is currently taking Glucotrol 5 mg daily and Farxiga 10 mg daily.  States since which was in November 2021 has been compliant with medications.    Review of Systems    Diabetes, hyperlipidemia, hypertension. Objective:   Physical Exam No acute distress.  BP is 142/93, pulse 89, respiration 12, temperature 98.6, patient 96% O2 sat on room air.       Assessment & Plan: Diabetes  Diabetes.  Hemoglobin A1c has decreased from 8.1 which was 5 months ago to 7.0 today.  Discussed rationale for compliance of medications to prevent further comorbidities.  Advised 68-month follow-up.

## 2020-09-22 ENCOUNTER — Other Ambulatory Visit: Payer: Self-pay

## 2020-09-22 DIAGNOSIS — E119 Type 2 diabetes mellitus without complications: Secondary | ICD-10-CM

## 2020-09-23 MED ORDER — GLIPIZIDE ER 5 MG PO TB24: 5.0000 mg | ORAL_TABLET | Freq: Every day | ORAL | 3 refills | Status: AC

## 2020-10-04 ENCOUNTER — Other Ambulatory Visit: Payer: Self-pay

## 2020-10-04 DIAGNOSIS — E7849 Other hyperlipidemia: Secondary | ICD-10-CM

## 2020-10-04 MED ORDER — ATORVASTATIN CALCIUM 10 MG PO TABS: 10.0000 mg | ORAL_TABLET | Freq: Every day | ORAL | 1 refills | Status: AC

## 2021-01-19 ENCOUNTER — Other Ambulatory Visit: Payer: Self-pay

## 2021-01-19 NOTE — Progress Notes (Signed)
Pt completed pre-employment drug screen. HR notified. /CL,RMA 

## 2021-07-02 ENCOUNTER — Other Ambulatory Visit: Payer: Self-pay | Admitting: Adult Health

## 2021-07-02 DIAGNOSIS — E7849 Other hyperlipidemia: Secondary | ICD-10-CM

## 2021-08-18 ENCOUNTER — Other Ambulatory Visit: Payer: Self-pay | Admitting: Internal Medicine

## 2021-08-18 DIAGNOSIS — M5116 Intervertebral disc disorders with radiculopathy, lumbar region: Secondary | ICD-10-CM

## 2021-08-28 ENCOUNTER — Other Ambulatory Visit: Payer: Self-pay | Admitting: Podiatry

## 2021-08-31 ENCOUNTER — Ambulatory Visit
Admission: RE | Admit: 2021-08-31 | Discharge: 2021-08-31 | Disposition: A | Discharge status: Home / Self Care | Payer: Medicare Other | Source: Ambulatory Visit | Attending: Internal Medicine | Admitting: Internal Medicine

## 2021-08-31 DIAGNOSIS — M5116 Intervertebral disc disorders with radiculopathy, lumbar region: Secondary | ICD-10-CM

## 2023-09-16 IMAGING — MR MR LUMBAR SPINE W/O CM
4 of 5 series · 19 of 48 positions shown · non-contrast
Comparison: [HOSPITAL] Lumbar MRI 03/02/2009
and [HOSPITAL] Lumbar CT myelogram 03/12/2009.

CLINICAL DATA: 65-year-old male with low back pain radiating to the
left buttock and posterior thigh for 4 months. Remote history of
prior lumbar surgery.

EXAM:
MRI LUMBAR SPINE WITHOUT CONTRAST
TECHNIQUE: Multiplanar, multisequence MR imaging of the lumbar spine was
performed. No intravenous contrast was administered.

[Series 6: T2 · sagittal · 4.0mm · 0.73mm/px · 8 of 19 slices shown (1 of 2)]
[im 1/19]
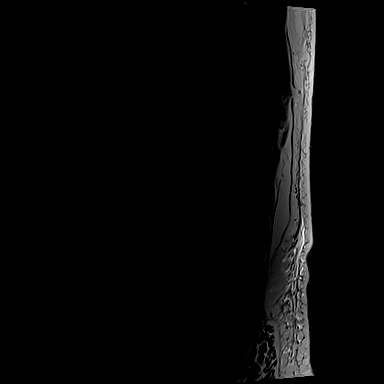
[im 3/19]
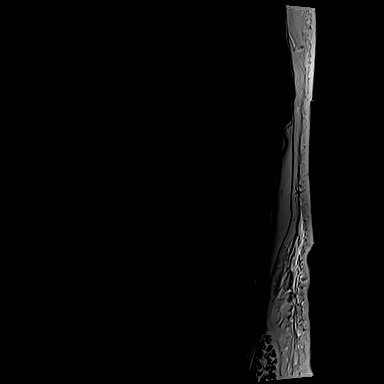
[im 6/19]
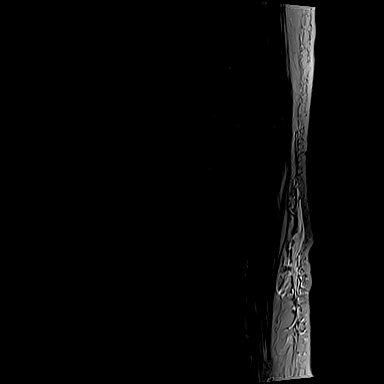
[im 8/19]
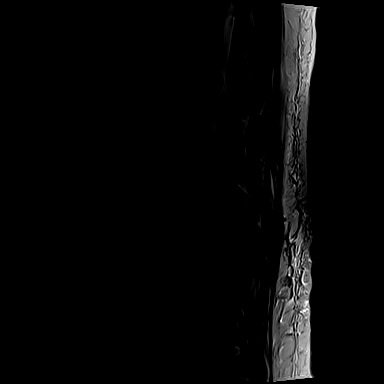
[im 11/19]
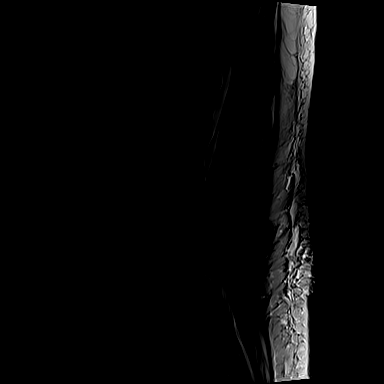
[im 13/19]
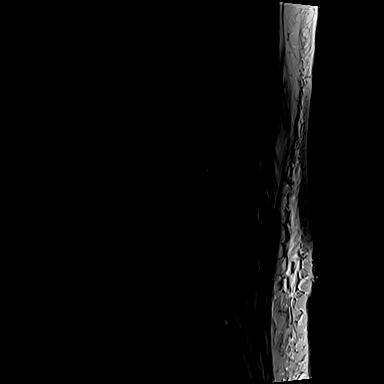
[im 16/19]
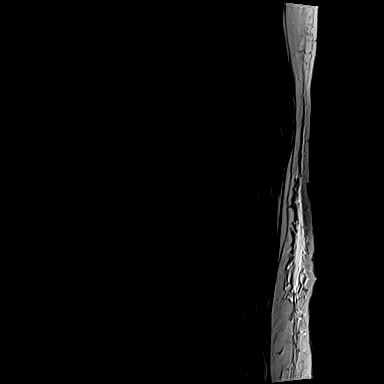
[im 19/19]
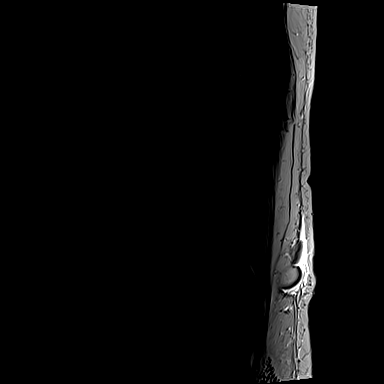

[Series 7: T1 · sagittal · 4.0mm · 0.73mm/px · 3 of 19 slices shown (1 of 2)]
[im 3/19]
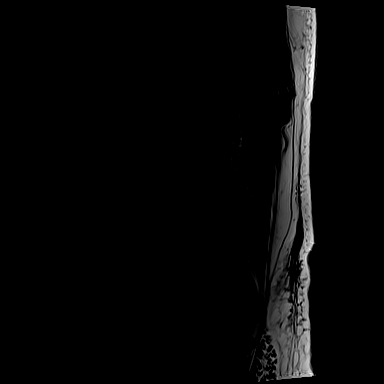
[im 10/19]
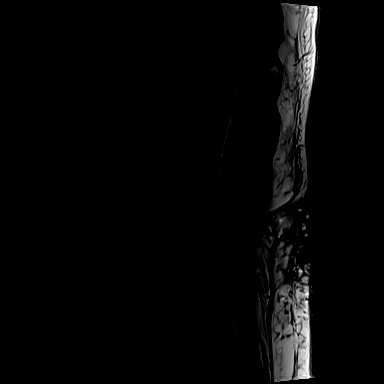
[im 16/19]
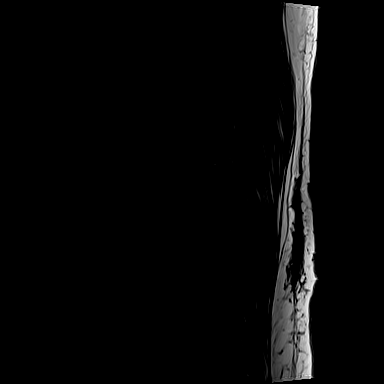

[Series 13: T2 · axial · 4.0mm · 0.28mm/px · z∈[+8,+190]mm · 7 of 45 slices shown (2 of 2)]
[im 3/45]
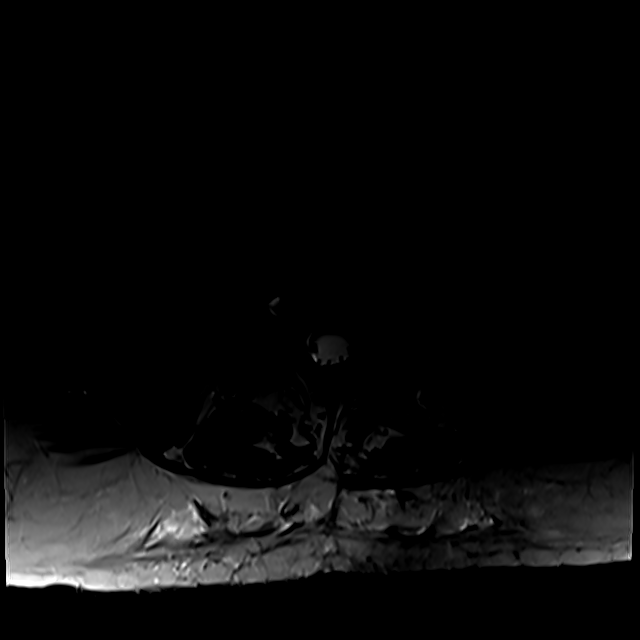
[im 7/45]
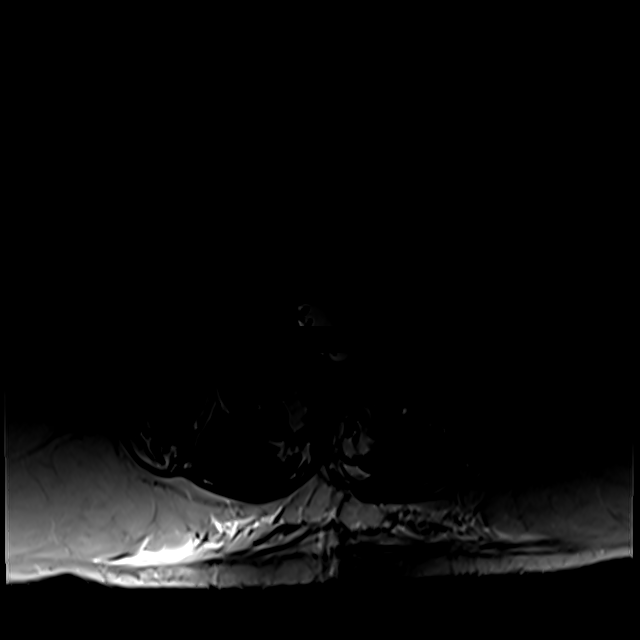
[im 14/45]
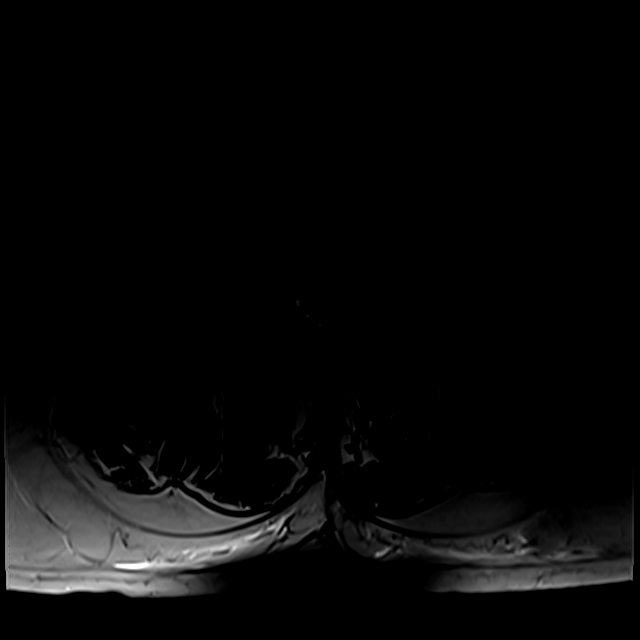
[im 20/45]
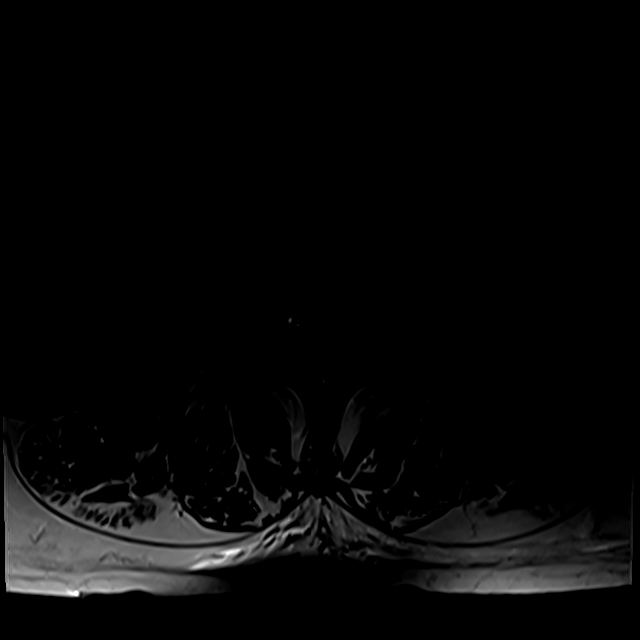
[im 23/45]
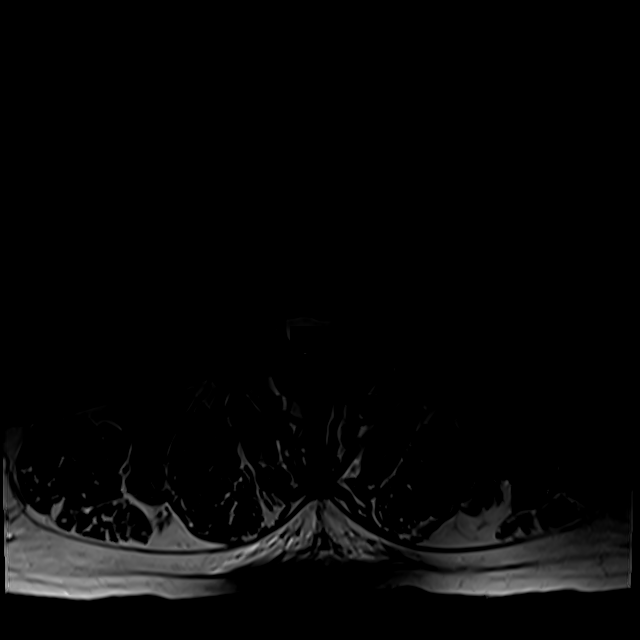
[im 25/45]
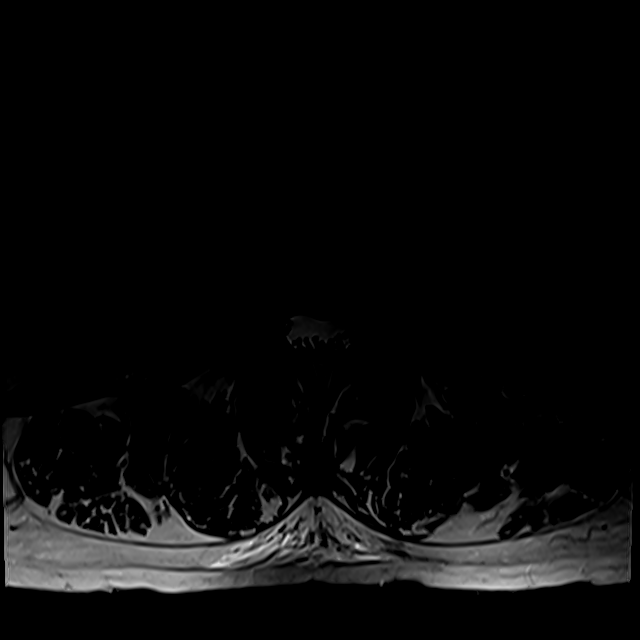
[im 38/45]
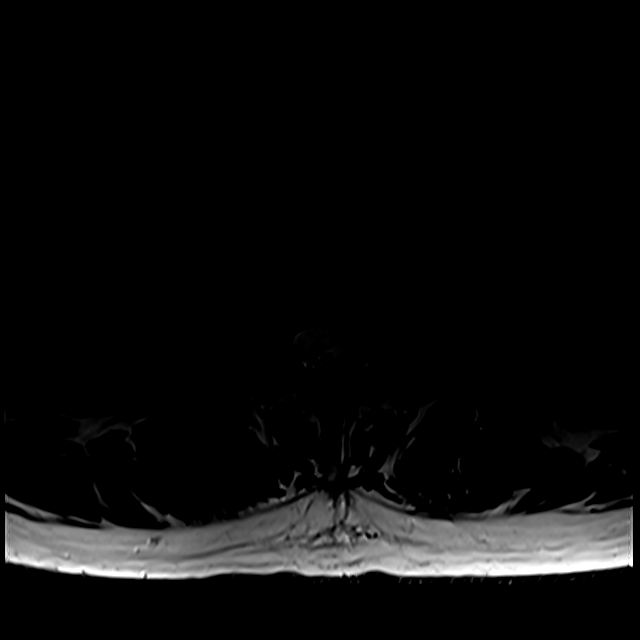

[Series 100: T1 · axial · 4.0mm · 0.28mm/px · 1 of 1 slices shown (2 of 2)]
[im 1/1]
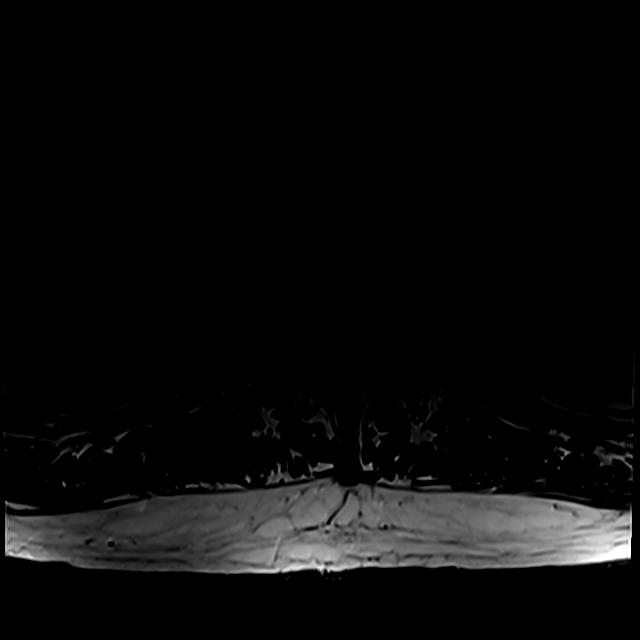

[19 of 48 positions shown; findings below may reference images not displayed]

FINDINGS: Segmentation:  Normal on the comparison CT.

Alignment: Straightening of lumbar lordosis has not significantly
changed since 4666. Subtle levoconvex lumbar scoliosis. No
significant spondylolisthesis.

Vertebrae: No marrow edema or evidence of acute osseous abnormality.
Visualized bone marrow signal is within normal limits.

Conus medullaris and cauda equina: Conus extends to the T12-L1
level. No lower spinal cord or conus signal abnormality. Normal
cauda equina nerve roots.

Paraspinal and other soft tissues: Negative.

Disc levels:

T12-L1:  Negative.

L1-L2: Disc desiccation and disc space loss. Mostly anterior disc
bulging. No stenosis.

L2-L3: Mild disc desiccation and circumferential disc bulge. Mild
facet hypertrophy greater on the left. No significant stenosis.

L3-L4: Disc desiccation with circumferential but mostly anterior
disc bulging. Mild to moderate facet hypertrophy. No significant
stenosis.

L4-L5: Circumferential disc bulge and endplate spurring. Moderate
facet hypertrophy greater on the left. Trace degenerative facet
joint fluid on the right. Chronic postoperative changes to the left
lamina here. No significant spinal or lateral recess stenosis. Mild
to moderate left and mild right L4 neural foraminal stenosis.

L5-S1: Previous left laminectomy. Disc space loss with
circumferential disc osteophyte complex. Broad-based bilateral
foraminal involvement. Mild to moderate residual facet hypertrophy
greater on the left. Architectural distortion at the left lateral
recess with no convincing spinal or lateral recess stenosis.
Moderate left and mild right L5 foraminal stenosis.
IMPRESSION: No acute osseous abnormality. Chronic postoperative changes on the
left at L4-L5 and L5-S1. No significant lumbar or convincing lateral
recess stenosis, but Moderate multifactorial left side neural
foraminal stenosis at both levels. Query Left L4 and/or L5
radiculitis.

## 2023-12-26 ENCOUNTER — Ambulatory Visit (INDEPENDENT_AMBULATORY_CARE_PROVIDER_SITE_OTHER)

## 2023-12-26 DIAGNOSIS — R220 Localized swelling, mass and lump, head: Secondary | ICD-10-CM

## 2023-12-26 DIAGNOSIS — R609 Edema, unspecified: Secondary | ICD-10-CM

## 2023-12-26 DIAGNOSIS — L820 Inflamed seborrheic keratosis: Secondary | ICD-10-CM

## 2023-12-26 DIAGNOSIS — D692 Other nonthrombocytopenic purpura: Secondary | ICD-10-CM

## 2023-12-26 DIAGNOSIS — L821 Other seborrheic keratosis: Secondary | ICD-10-CM

## 2023-12-26 DIAGNOSIS — L814 Other melanin hyperpigmentation: Secondary | ICD-10-CM | POA: Diagnosis not present

## 2023-12-26 NOTE — Patient Instructions (Addendum)

## 2023-12-26 NOTE — Progress Notes (Signed)
   New Patient Visit   Subjective  Mitchell Whitehead is a 68 y.o. male who presents for the following: Patient has areas of concern on bilateral arms, face, trunk   The following portions of the chart were reviewed this encounter and updated as appropriate: medications, allergies, medical history  Review of Systems:  No other skin or systemic complaints except as noted in HPI or Assessment and Plan.  Objective  Well appearing patient in no apparent distress; mood and affect are within normal limits.  A focused examination was performed of the following areas: Arms, Face, trunk  Purpuric patch on L arm - Multiple stuck-on brown, tan and grey papillated papules and plaques  - 6-20 mm pigmented macules that are tan to brown in color and are somewhat non-uniform in shape and concentrated in the sun-exposed areas  - Bilateral swelling of L > R parotid glands. Non tender. No LAD.    Relevant exam findings are noted in the Assessment and Plan.       Right Shoulder - Posterior x3 (3) Stuck on waxy paps with erythema  Assessment & Plan   Actinic/Solar purpura of bilateral forearms- explained relationship between sun exposure and loss of integrity of skin, and how minor trauma can cause noticeable bruising Counseled on the benign nature of this condition Counseled regarding sun protection including consistent use of daily sunscreen (SPF 30 or greater, broad spectrum, water resistant, reapply every 2-4 hours), sun protective clothing and sun avoidance during peak hours (10 AM-3PM). Advised to avoid trauma as much as possible Use protective clothing. May use OTC arnica bruise gel, vitamin K infused moisturizers, recommend DerMend moisturizing bruise formula cream  Bilateral facial swelling L > R   - DDX - parotitis vs other. Did have recent viral illness that could have triggered - Offered imaging with ultrasound vs CT. Patient prefers to discuss with PCP   BENIGN SKIN FINDINGS  -  Lentigines  - Seborrheic keratoses - Reassurance provided regarding the benign appearance of lesions noted on exam today; no treatment is indicated in the absence of symptoms/changes. - Reinforced importance of photoprotective strategies including liberal and frequent sunscreen use of a broad-spectrum SPF 30 or greater, use of protective clothing, and sun avoidance for prevention of cutaneous malignancy and photoaging.  Counseled patient on the importance of regular self-skin monitoring as well as routine clinical skin examinations as scheduled.  INFLAMED SEBORRHEIC KERATOSIS (3) Right Shoulder - Posterior x3 (3) Symptomatic, irritating, patient would like treated. Destruction of lesion - Right Shoulder - Posterior x3 (3)  Destruction method: cryotherapy   Informed consent: discussed and consent obtained   Lesion destroyed using liquid nitrogen: Yes   Region frozen until ice ball extended beyond lesion: Yes   Outcome: patient tolerated procedure well with no complications   Post-procedure details: wound care instructions given   Additional details:  Prior to procedure, discussed risks of blister formation, small wound, skin dyspigmentation, or rare scar following cryotherapy. Recommend Vaseline ointment to treated areas while healing.    Return for TBSE.  I, Emerick Ege, CMA am acting as scribe for Lauraine JAYSON Kanaris, MD  Documentation: I have reviewed the above documentation for accuracy and completeness, and I agree with the above.  Lauraine JAYSON Kanaris, MD

## 2024-01-23 ENCOUNTER — Other Ambulatory Visit: Payer: Self-pay | Admitting: Internal Medicine

## 2024-01-23 DIAGNOSIS — N401 Enlarged prostate with lower urinary tract symptoms: Secondary | ICD-10-CM

## 2024-01-23 DIAGNOSIS — K118 Other diseases of salivary glands: Secondary | ICD-10-CM

## 2024-02-06 ENCOUNTER — Ambulatory Visit
Admission: RE | Admit: 2024-02-06 | Discharge: 2024-02-06 | Disposition: A | Discharge status: Home / Self Care | Source: Ambulatory Visit | Attending: Internal Medicine | Admitting: Internal Medicine

## 2024-02-06 DIAGNOSIS — R338 Other retention of urine: Secondary | ICD-10-CM | POA: Insufficient documentation

## 2024-02-06 DIAGNOSIS — K118 Other diseases of salivary glands: Secondary | ICD-10-CM | POA: Diagnosis present

## 2024-02-06 DIAGNOSIS — N401 Enlarged prostate with lower urinary tract symptoms: Secondary | ICD-10-CM | POA: Insufficient documentation

## 2024-02-06 LAB — POCT I-STAT CREATININE: Creatinine, Ser: 0.9 mg/dL (ref 0.61–1.24)

## 2024-02-06 MED ORDER — SODIUM CHLORIDE 0.9 % IV SOLN: INTRAVENOUS | Status: DC

## 2024-02-06 MED ORDER — IOHEXOL 300 MG/ML  SOLN
75.0000 mL | Freq: Once | INTRAMUSCULAR | Status: AC | PRN
  Administered 2024-02-06: 75 mL via INTRAVENOUS

## 2024-02-15 ENCOUNTER — Encounter (INDEPENDENT_AMBULATORY_CARE_PROVIDER_SITE_OTHER): Payer: Self-pay | Admitting: Vascular Surgery

## 2024-02-15 ENCOUNTER — Ambulatory Visit (INDEPENDENT_AMBULATORY_CARE_PROVIDER_SITE_OTHER): Admitting: Vascular Surgery

## 2024-02-15 VITALS — BP 156/84 | HR 80 | Resp 18 | Ht 74.0 in | Wt 273.0 lb

## 2024-02-15 DIAGNOSIS — E7849 Other hyperlipidemia: Secondary | ICD-10-CM | POA: Diagnosis not present

## 2024-02-15 DIAGNOSIS — I10 Essential (primary) hypertension: Secondary | ICD-10-CM | POA: Diagnosis not present

## 2024-02-15 DIAGNOSIS — I6521 Occlusion and stenosis of right carotid artery: Secondary | ICD-10-CM | POA: Diagnosis not present

## 2024-02-15 DIAGNOSIS — E119 Type 2 diabetes mellitus without complications: Secondary | ICD-10-CM | POA: Diagnosis not present

## 2024-02-15 DIAGNOSIS — I6529 Occlusion and stenosis of unspecified carotid artery: Secondary | ICD-10-CM | POA: Insufficient documentation

## 2024-02-15 MED ORDER — CLOPIDOGREL BISULFATE 75 MG PO TABS: 75.0000 mg | ORAL_TABLET | Freq: Every day | ORAL | 6 refills | Status: AC

## 2024-02-15 NOTE — Progress Notes (Signed)
 Patient ID: Mitchell Whitehead, male   DOB: 03/23/1956, 68 y.o.   MRN: [id number]  Chief Complaint  Patient presents with   New Patient (Initial Visit)    Ref Cleotilde consult right ICA stenosis    HPI Mitchell Whitehead is a 68 y.o. male.  I am asked to see the patient by Dr. Cleotilde for evaluation of his carotid stenosis.  This was recently found incidentally on a CT scan of the neck performed for left neck swelling.  I have independently reviewed the CT scan.  This is not a CT angiogram but was done with contrast.  This demonstrates what appears to be a quite high-grade stenosis of the right carotid artery.  This was interpreted as a radiographic string sign and that may in fact be the case.  The left carotid artery had very mild stenosis that did not appear flow-limiting.  He has not had any arm or leg weakness or numbness or speech or swallowing difficulty.  He has been having recurring and worsening visual symptoms in the right eye however.  He is on Lipitor but not any anticoagulants.   Past Medical History:  Diagnosis Date   Degenerative disc disease, lumbar    Diabetes mellitus without complication (HCC)    History of prostatitis    Hypertension    Impingement syndrome of left shoulder    Left buttock pain    Sleep apnea    doesnt use cpap    Past Surgical History:  Procedure Laterality Date   BACK SURGERY     COLONOSCOPY     COLONOSCOPY WITH PROPOFOL  N/A 06/15/2017   Procedure: COLONOSCOPY WITH PROPOFOL ;  Surgeon: Viktoria Lamar DASEN, MD;  Location: [facility] ENDOSCOPY;  Service: Endoscopy;  Laterality: N/A;   WISDOM TOOTH EXTRACTION       Family History  Problem Relation Age of Onset   Lung cancer Mother   No bleeding or clotting disorders No aneurysms   Social History   Tobacco Use   Smoking status: Never   Smokeless tobacco: Never  Vaping Use   Vaping status: Never Used  Substance Use Topics   Alcohol use: Yes    Alcohol/week: 7.0 standard drinks of alcohol    Types: 7  Cans of beer per week    Comment: Daily or almost daily    Drug use: No     Allergies  Allergen Reactions   Grapeseed Extract [Nutritional Supplements]     Current Outpatient Medications  Medication Sig Dispense Refill   atorvastatin  (LIPITOR) 10 MG tablet Take 1 tablet (10 mg total) by mouth daily. 90 tablet 1   clopidogrel (PLAVIX) 75 MG tablet Take 1 tablet (75 mg total) by mouth daily. 30 tablet 6   glipiZIDE  (GLUCOTROL  XL) 5 MG 24 hr tablet Take 1 tablet (5 mg total) by mouth daily with breakfast. 90 tablet 3   ibuprofen (ADVIL,MOTRIN) 200 MG tablet Take 200 mg by mouth every 6 (six) hours as needed for moderate pain.      pioglitazone (ACTOS) 30 MG tablet Take 30 mg by mouth daily.     FARXIGA  10 MG TABS tablet Take 1 tablet (10 mg total) by mouth daily. (Patient not taking: Reported on 02/15/2024) 90 tablet 0   losartan  (COZAAR ) 50 MG tablet Take 1 tablet (50 mg total) by mouth daily. (Patient not taking: Reported on 02/15/2024) 90 tablet 3   No current facility-administered medications for this visit.      REVIEW OF SYSTEMS (Negative  unless checked)  Constitutional: [] Weight loss  [] Fever  [] Chills Cardiac: [] Chest pain   [] Chest pressure   [] Palpitations   [] Shortness of breath when laying flat   [] Shortness of breath at rest   [] Shortness of breath with exertion. Vascular:  [] Pain in legs with walking   [] Pain in legs at rest   [] Pain in legs when laying flat   [] Claudication   [] Pain in feet when walking  [] Pain in feet at rest  [] Pain in feet when laying flat   [] History of DVT   [] Phlebitis   [] Swelling in legs   [] Varicose veins   [] Non-healing ulcers Pulmonary:   [] Uses home oxygen   [] Productive cough   [] Hemoptysis   [] Wheeze  [] COPD   [] Asthma Neurologic:  [] Dizziness  [] Blackouts   [] Seizures   [] History of stroke   [] History of TIA  [] Aphasia   [x] Temporary blindness   [] Dysphagia   [] Weakness or numbness in arms   [] Weakness or numbness in legs Musculoskeletal:   [] Arthritis   [] Joint swelling   [x] Joint pain   [x] Low back pain Hematologic:  [] Easy bruising  [] Easy bleeding   [] Hypercoagulable state   [] Anemic  [] Hepatitis Gastrointestinal:  [] Blood in stool   [] Vomiting blood  [] Gastroesophageal reflux/heartburn   [] Abdominal pain Genitourinary:  [] Chronic kidney disease   [] Difficult urination  [] Frequent urination  [] Burning with urination   [] Hematuria Skin:  [] Rashes   [] Ulcers   [] Wounds Psychological:  [] History of anxiety   []  History of major depression.    Physical Exam BP (!) 156/84   Pulse 80   Resp 18   Ht 6' 2 (1.88 m)   Wt 273 lb (123.8 kg)   BMI 35.05 kg/m  Gen:  WD/WN, NAD Head: [place]/AT, No temporalis wasting.  Ear/Nose/Throat: Hearing grossly intact, nares w/o erythema or drainage, oropharynx w/o Erythema/Exudate Eyes: Conjunctiva clear, sclera non-icteric  Neck: trachea midline.  No JVD.  Mild left neck swelling is present. Pulmonary:  Good air movement, respirations not labored, no use of accessory muscles  Cardiac: RRR, no JVD Vascular:  Vessel Right Left  Radial Palpable Palpable                                   Gastrointestinal:. No masses, surgical incisions, or scars. Musculoskeletal: M/S 5/5 throughout.  Extremities without ischemic changes.  No deformity or atrophy. No LE edema. Neurologic: Sensation grossly intact in extremities.  Symmetrical.  Speech is fluent. Motor exam as listed above. Psychiatric: Judgment intact, Mood & affect appropriate for pt's clinical situation. Dermatologic: No rashes or ulcers noted.  No cellulitis or open wounds.    Radiology CT SOFT TISSUE NECK W CONTRAST Result Date: 02/10/2024 EXAM: CT NECK WITH CONTRAST 02/06/2024 11:31:29 AM TECHNIQUE: CT of the neck was performed with the administration of 75 mL of iohexol (OMNIPAQUE) 300 MG/ML solution. Multiplanar reformatted images are provided for review. Automated exposure control, iterative reconstruction, and/or weight based  adjustment of the mA/kV was utilized to reduce the radiation dose to as low as reasonably achievable. COMPARISON: None available. CLINICAL HISTORY: 68 year old male with left facial/jaw swelling for 2-3 months. FINDINGS: AERODIGESTIVE TRACT: Glottis is closed. Laryngeal and pharyngeal soft tissue contours are within normal limits. Incidental postinflammatory calcifications of the tonsillar pillars. Negative parapharyngeal and retropharyngeal spaces. No discrete mass. No edema. SALIVARY GLANDS: Skin marker placed at the area of concern corresponds to the left parotid space anteriorly on series  2 image 37. The parotid glands appear symmetric and within normal limits. No parotid ductal dilatation. No sialolithiasis identified. No salivary gland mass or inflammation identified. The sublingual space appears negative. THYROID : Unremarkable. LYMPH NODES: No suspicious cervical lymphadenopathy. SOFT TISSUES: No mass or fluid collection. BRAIN, ORBITS, SINUSES AND MASTOIDS: Mild maxillary alveolar recess mucosal thickening, minor ethmoid sinus mucosal thickening; these appear inconsequential. No acute abnormality. LUNGS AND MEDIASTINUM: No acute abnormality. BONES: Bulky hyperostosis in the cervical and upper thoracic spine. Large anterior cervical vertebral endplates. Subsequent interbody ankylosis intermittently noted including C5-C6, C6-C7, and C7-T1. No focal bone abnormality. VASCULATURE: Severe carotid bifurcation atherosclerosis bilaterally. Very severe proximal right ICA atherosclerosis resulting in radiographic string sign stenosis on series 2 image 52, but the vessel remains patent. Other major vascular structures in the bilateral neck and at the skull base are enhancing and appear patent. Calcified aortic arch atherosclerosis. IMPRESSION: 1. Very severe proximal right ICA atherosclerosis with RADIOGRAPHIC STRING SIGN stenosis; vessel remains patent. Recommend vascular surgery consultation. 2. Otherwise negative CT  appearance of the Neck soft tissues: Left parotid gland marked as the area of concern appears symmetric and within normal limits. Negative for neck mass, lymphadenopathy, inflammation. Electronically signed by: Helayne Hurst MD 02/10/2024 10:37 AM EDT RP Workstation: [id number]    Labs Recent Results (from the past 2160 hours)  I-STAT creatinine     Status: None   Collection Time: 02/06/24 11:22 AM  Result Value Ref Range   Creatinine, Ser 0.90 0.61 - 1.24 mg/dL    Assessment/Plan:  Carotid stenosis I have independently reviewed the CT scan.  This is not a CT angiogram but was done with contrast.  This demonstrates what appears to be a quite high-grade stenosis of the right carotid artery.  This was interpreted as a radiographic string sign and that may in fact be the case.  The left carotid artery had very mild stenosis that did not appear flow-limiting.  I had a long discussion today with the patient regarding treatment options for high-grade carotid stenosis.  I discussed the difference tween for carotid artery stenting and carotid endarterectomy.  I discussed the performance of both procedures and the reason and rationale for treatment.  His lesion is fairly high and may be somewhat challenging surgically, and other than being moderately calcific I think it has fairly straightforward anatomy for carotid artery stenting.  He would prefer a carotid stent which is reasonable.  I will go ahead and prescribe Plavix now and we will begin that and have that on board for 5 to 7 days before the procedure.  I discussed the expected course of his hospitalization and the possible complications.  He is agreeable to proceed.  Type 2 diabetes mellitus without complication, without long-term current use of insulin (HCC) blood glucose control important in reducing the progression of atherosclerotic disease. Also, involved in wound healing. On appropriate medications.   Essential hypertension blood pressure  control important in reducing the progression of atherosclerotic disease. On appropriate oral medications.   Other hyperlipidemia lipid control important in reducing the progression of atherosclerotic disease. Continue statin therapy      Selinda Gu 02/15/2024, 1:49 PM   This note was created with Dragon medical transcription system.  Any errors from dictation are unintentional.

## 2024-02-15 NOTE — Assessment & Plan Note (Signed)
 lipid control important in reducing the progression of atherosclerotic disease. Continue statin therapy

## 2024-02-15 NOTE — Assessment & Plan Note (Signed)
 I have independently reviewed the CT scan.  This is not a CT angiogram but was done with contrast.  This demonstrates what appears to be a quite high-grade stenosis of the right carotid artery.  This was interpreted as a radiographic string sign and that may in fact be the case.  The left carotid artery had very mild stenosis that did not appear flow-limiting.  I had a long discussion today with the patient regarding treatment options for high-grade carotid stenosis.  I discussed the difference tween for carotid artery stenting and carotid endarterectomy.  I discussed the performance of both procedures and the reason and rationale for treatment.  His lesion is fairly high and may be somewhat challenging surgically, and other than being moderately calcific I think it has fairly straightforward anatomy for carotid artery stenting.  He would prefer a carotid stent which is reasonable.  I will go ahead and prescribe Plavix now and we will begin that and have that on board for 5 to 7 days before the procedure.  I discussed the expected course of his hospitalization and the possible complications.  He is agreeable to proceed.

## 2024-02-15 NOTE — Assessment & Plan Note (Signed)
 blood glucose control important in reducing the progression of atherosclerotic disease. Also, involved in wound healing. On appropriate medications.

## 2024-02-15 NOTE — Assessment & Plan Note (Signed)
 blood pressure control important in reducing the progression of atherosclerotic disease. On appropriate oral medications.

## 2024-02-15 NOTE — H&P (View-Only) (Signed)
 Patient ID: Mitchell Whitehead, male   DOB: 03/23/1956, 68 y.o.   MRN: [id number]  Chief Complaint  Patient presents with   New Patient (Initial Visit)    Ref Cleotilde consult right ICA stenosis    HPI Mitchell Whitehead is a 68 y.o. male.  I am asked to see the patient by Dr. Cleotilde for evaluation of his carotid stenosis.  This was recently found incidentally on a CT scan of the neck performed for left neck swelling.  I have independently reviewed the CT scan.  This is not a CT angiogram but was done with contrast.  This demonstrates what appears to be a quite high-grade stenosis of the right carotid artery.  This was interpreted as a radiographic string sign and that may in fact be the case.  The left carotid artery had very mild stenosis that did not appear flow-limiting.  He has not had any arm or leg weakness or numbness or speech or swallowing difficulty.  He has been having recurring and worsening visual symptoms in the right eye however.  He is on Lipitor but not any anticoagulants.   Past Medical History:  Diagnosis Date   Degenerative disc disease, lumbar    Diabetes mellitus without complication (HCC)    History of prostatitis    Hypertension    Impingement syndrome of left shoulder    Left buttock pain    Sleep apnea    doesnt use cpap    Past Surgical History:  Procedure Laterality Date   BACK SURGERY     COLONOSCOPY     COLONOSCOPY WITH PROPOFOL  N/A 06/15/2017   Procedure: COLONOSCOPY WITH PROPOFOL ;  Surgeon: Viktoria Lamar DASEN, MD;  Location: [facility] ENDOSCOPY;  Service: Endoscopy;  Laterality: N/A;   WISDOM TOOTH EXTRACTION       Family History  Problem Relation Age of Onset   Lung cancer Mother   No bleeding or clotting disorders No aneurysms   Social History   Tobacco Use   Smoking status: Never   Smokeless tobacco: Never  Vaping Use   Vaping status: Never Used  Substance Use Topics   Alcohol use: Yes    Alcohol/week: 7.0 standard drinks of alcohol    Types: 7  Cans of beer per week    Comment: Daily or almost daily    Drug use: No     Allergies  Allergen Reactions   Grapeseed Extract [Nutritional Supplements]     Current Outpatient Medications  Medication Sig Dispense Refill   atorvastatin  (LIPITOR) 10 MG tablet Take 1 tablet (10 mg total) by mouth daily. 90 tablet 1   clopidogrel (PLAVIX) 75 MG tablet Take 1 tablet (75 mg total) by mouth daily. 30 tablet 6   glipiZIDE  (GLUCOTROL  XL) 5 MG 24 hr tablet Take 1 tablet (5 mg total) by mouth daily with breakfast. 90 tablet 3   ibuprofen (ADVIL,MOTRIN) 200 MG tablet Take 200 mg by mouth every 6 (six) hours as needed for moderate pain.      pioglitazone (ACTOS) 30 MG tablet Take 30 mg by mouth daily.     FARXIGA  10 MG TABS tablet Take 1 tablet (10 mg total) by mouth daily. (Patient not taking: Reported on 02/15/2024) 90 tablet 0   losartan  (COZAAR ) 50 MG tablet Take 1 tablet (50 mg total) by mouth daily. (Patient not taking: Reported on 02/15/2024) 90 tablet 3   No current facility-administered medications for this visit.      REVIEW OF SYSTEMS (Negative  unless checked)  Constitutional: [] Weight loss  [] Fever  [] Chills Cardiac: [] Chest pain   [] Chest pressure   [] Palpitations   [] Shortness of breath when laying flat   [] Shortness of breath at rest   [] Shortness of breath with exertion. Vascular:  [] Pain in legs with walking   [] Pain in legs at rest   [] Pain in legs when laying flat   [] Claudication   [] Pain in feet when walking  [] Pain in feet at rest  [] Pain in feet when laying flat   [] History of DVT   [] Phlebitis   [] Swelling in legs   [] Varicose veins   [] Non-healing ulcers Pulmonary:   [] Uses home oxygen   [] Productive cough   [] Hemoptysis   [] Wheeze  [] COPD   [] Asthma Neurologic:  [] Dizziness  [] Blackouts   [] Seizures   [] History of stroke   [] History of TIA  [] Aphasia   [x] Temporary blindness   [] Dysphagia   [] Weakness or numbness in arms   [] Weakness or numbness in legs Musculoskeletal:   [] Arthritis   [] Joint swelling   [x] Joint pain   [x] Low back pain Hematologic:  [] Easy bruising  [] Easy bleeding   [] Hypercoagulable state   [] Anemic  [] Hepatitis Gastrointestinal:  [] Blood in stool   [] Vomiting blood  [] Gastroesophageal reflux/heartburn   [] Abdominal pain Genitourinary:  [] Chronic kidney disease   [] Difficult urination  [] Frequent urination  [] Burning with urination   [] Hematuria Skin:  [] Rashes   [] Ulcers   [] Wounds Psychological:  [] History of anxiety   []  History of major depression.    Physical Exam BP (!) 156/84   Pulse 80   Resp 18   Ht 6' 2 (1.88 m)   Wt 273 lb (123.8 kg)   BMI 35.05 kg/m  Gen:  WD/WN, NAD Head: [place]/AT, No temporalis wasting.  Ear/Nose/Throat: Hearing grossly intact, nares w/o erythema or drainage, oropharynx w/o Erythema/Exudate Eyes: Conjunctiva clear, sclera non-icteric  Neck: trachea midline.  No JVD.  Mild left neck swelling is present. Pulmonary:  Good air movement, respirations not labored, no use of accessory muscles  Cardiac: RRR, no JVD Vascular:  Vessel Right Left  Radial Palpable Palpable                                   Gastrointestinal:. No masses, surgical incisions, or scars. Musculoskeletal: M/S 5/5 throughout.  Extremities without ischemic changes.  No deformity or atrophy. No LE edema. Neurologic: Sensation grossly intact in extremities.  Symmetrical.  Speech is fluent. Motor exam as listed above. Psychiatric: Judgment intact, Mood & affect appropriate for pt's clinical situation. Dermatologic: No rashes or ulcers noted.  No cellulitis or open wounds.    Radiology CT SOFT TISSUE NECK W CONTRAST Result Date: 02/10/2024 EXAM: CT NECK WITH CONTRAST 02/06/2024 11:31:29 AM TECHNIQUE: CT of the neck was performed with the administration of 75 mL of iohexol (OMNIPAQUE) 300 MG/ML solution. Multiplanar reformatted images are provided for review. Automated exposure control, iterative reconstruction, and/or weight based  adjustment of the mA/kV was utilized to reduce the radiation dose to as low as reasonably achievable. COMPARISON: None available. CLINICAL HISTORY: 68 year old male with left facial/jaw swelling for 2-3 months. FINDINGS: AERODIGESTIVE TRACT: Glottis is closed. Laryngeal and pharyngeal soft tissue contours are within normal limits. Incidental postinflammatory calcifications of the tonsillar pillars. Negative parapharyngeal and retropharyngeal spaces. No discrete mass. No edema. SALIVARY GLANDS: Skin marker placed at the area of concern corresponds to the left parotid space anteriorly on series  2 image 37. The parotid glands appear symmetric and within normal limits. No parotid ductal dilatation. No sialolithiasis identified. No salivary gland mass or inflammation identified. The sublingual space appears negative. THYROID : Unremarkable. LYMPH NODES: No suspicious cervical lymphadenopathy. SOFT TISSUES: No mass or fluid collection. BRAIN, ORBITS, SINUSES AND MASTOIDS: Mild maxillary alveolar recess mucosal thickening, minor ethmoid sinus mucosal thickening; these appear inconsequential. No acute abnormality. LUNGS AND MEDIASTINUM: No acute abnormality. BONES: Bulky hyperostosis in the cervical and upper thoracic spine. Large anterior cervical vertebral endplates. Subsequent interbody ankylosis intermittently noted including C5-C6, C6-C7, and C7-T1. No focal bone abnormality. VASCULATURE: Severe carotid bifurcation atherosclerosis bilaterally. Very severe proximal right ICA atherosclerosis resulting in radiographic string sign stenosis on series 2 image 52, but the vessel remains patent. Other major vascular structures in the bilateral neck and at the skull base are enhancing and appear patent. Calcified aortic arch atherosclerosis. IMPRESSION: 1. Very severe proximal right ICA atherosclerosis with RADIOGRAPHIC STRING SIGN stenosis; vessel remains patent. Recommend vascular surgery consultation. 2. Otherwise negative CT  appearance of the Neck soft tissues: Left parotid gland marked as the area of concern appears symmetric and within normal limits. Negative for neck mass, lymphadenopathy, inflammation. Electronically signed by: Helayne Hurst MD 02/10/2024 10:37 AM EDT RP Workstation: [id number]    Labs Recent Results (from the past 2160 hours)  I-STAT creatinine     Status: None   Collection Time: 02/06/24 11:22 AM  Result Value Ref Range   Creatinine, Ser 0.90 0.61 - 1.24 mg/dL    Assessment/Plan:  Carotid stenosis I have independently reviewed the CT scan.  This is not a CT angiogram but was done with contrast.  This demonstrates what appears to be a quite high-grade stenosis of the right carotid artery.  This was interpreted as a radiographic string sign and that may in fact be the case.  The left carotid artery had very mild stenosis that did not appear flow-limiting.  I had a long discussion today with the patient regarding treatment options for high-grade carotid stenosis.  I discussed the difference tween for carotid artery stenting and carotid endarterectomy.  I discussed the performance of both procedures and the reason and rationale for treatment.  His lesion is fairly high and may be somewhat challenging surgically, and other than being moderately calcific I think it has fairly straightforward anatomy for carotid artery stenting.  He would prefer a carotid stent which is reasonable.  I will go ahead and prescribe Plavix now and we will begin that and have that on board for 5 to 7 days before the procedure.  I discussed the expected course of his hospitalization and the possible complications.  He is agreeable to proceed.  Type 2 diabetes mellitus without complication, without long-term current use of insulin (HCC) blood glucose control important in reducing the progression of atherosclerotic disease. Also, involved in wound healing. On appropriate medications.   Essential hypertension blood pressure  control important in reducing the progression of atherosclerotic disease. On appropriate oral medications.   Other hyperlipidemia lipid control important in reducing the progression of atherosclerotic disease. Continue statin therapy      Selinda Gu 02/15/2024, 1:49 PM   This note was created with Dragon medical transcription system.  Any errors from dictation are unintentional.

## 2024-02-15 NOTE — Patient Instructions (Signed)
Carotid Angioplasty With Stent Carotid angioplasty with stent is a procedure to open or widen an artery in the neck (carotid artery) that has become narrowed. This is done by inflating a small balloon inside the artery and then placing a small piece of metal that looks like a coil or spring (stent) inside the artery. The stent helps keep the artery open by supporting the artery walls. The carotid arteries supply blood to the brain. When fats, cholesterol, and other materials (plaque) build up in an artery, the artery becomes narrow and can become blocked. This can reduce or block blood flow to certain areas of the brain, which can cause serious health problems, including stroke. The stent helps to keep the artery open so that blood can flow to the brain. Tell a health care provider about: Any allergies you have. All medicines you are taking, including vitamins, herbs, eye drops, creams, and over-the-counter medicines. Any problems you or family members have had with anesthesia. Any bleeding problems you have. Any surgeries you have had. Any medical conditions you have. Whether you are pregnant or may be pregnant. What are the risks? Your health care provider will talk with you about risks. These may include: Stroke. The stent becoming blocked. Problems at the access site, such as a large amount of blood collecting under your skin (hematoma). Allergic reactions to medicines or dyes. Damage to other structures or organs, or to the carotid artery. Infection. Heart attack. Death. This is rare. What happens before the procedure? Follow instructions from your health care provider about what you may eat and drink. Ask your health care provider about: Changing or stopping your regular medicines. These include any diabetes medicines or blood thinners (anticoagulants) you take. Whether aspirin or other blood thinners are recommended before this procedure. Taking over-the-counter medicines, vitamins,  herbs, and supplements. Do not use any products that contain nicotine or tobacco for at least 4 weeks before the procedure. These products include cigarettes, chewing tobacco, and vaping devices, such as e-cigarettes. If you need help quitting, ask your health care provider. Ask your health care provider: How your surgery site will be marked. What steps will be taken to help prevent infection. These steps may include washing skin with a soap that kills germs. You may have blood tests and imaging tests. What happens during the procedure?  An IV will be inserted into one of your veins. You may be given: A sedative. This helps you relax. Anesthesia. This will: Numb certain areas of your body. Make you fall asleep for surgery. Most commonly, an incision will be made in your groin. In some cases, an incision may be made in your wrist or forearm instead of your groin. A small, thin tube (catheter) will be inserted through an incision and into an artery. The catheter will be threaded upward into your carotid artery. An X-ray machine will help your health care provider guide the catheter to the correct place in your artery. Dye will be injected into the catheter and will travel to the narrow or blocked part of your carotid artery. X-ray images will be taken of how the dye flows through your artery. While the images are being taken, you may be given instructions about breathing, swallowing, moving, or talking. A filter will be inserted into your artery. This will be used to catch plaque that comes loose in your artery during the procedure. This reduces the risk of plaque moving into your brain. A small balloon will be inserted into your artery.  The balloon will be inflated for a few seconds to widen your artery and will then be removed. The stent will be placed in your artery. A second small balloon will be inserted into your artery and inflated. This expands the stent inside of your artery so that the  stent holds the artery walls open. The second balloon will then be removed. The catheter, filter, and first balloon will be removed from your artery and pressure will be held on your carotid artery to stop bleeding. Your incision may be closed with stitches (sutures), skin glue, or adhesive strips. A bandage (dressing) will be placed over your incision. The procedure may vary among health care providers and hospitals. What happens after the procedure? Your blood pressure, heart rate, breathing rate, and blood oxygen level will be monitored until you leave the hospital or clinic. Your mental status and movements (neurological status) will be monitored. You may need to have pressure placed on the incision site to prevent bleeding. You will need to keep the area still for a few hours, or as long as told by your health care provider. If the procedure was done in your groin, you will be told not to bend or cross your legs. Most people stay in the hospital overnight. This information is not intended to replace advice given to you by your health care provider. Make sure you discuss any questions you have with your health care provider. Document Revised: 09/13/2021 Document Reviewed: 09/13/2021 Elsevier Patient Education  2024 [employer].

## 2024-02-20 ENCOUNTER — Telehealth (INDEPENDENT_AMBULATORY_CARE_PROVIDER_SITE_OTHER): Payer: Self-pay

## 2024-02-20 NOTE — Telephone Encounter (Signed)
 Spoke with the patient and he is scheduled with Dr. Marea for a right carotid stent placement on 03/03/24 with a 6:45 am arrival time to the [facility]. Pre-procedure instructions were discussed and will be sent to Mychart and mailed.

## 2024-03-03 ENCOUNTER — Encounter
Admission: RE | Disposition: A | Discharge status: Home / Self Care | Payer: Self-pay | Source: Home / Self Care | Attending: Vascular Surgery

## 2024-03-03 ENCOUNTER — Other Ambulatory Visit: Payer: Self-pay

## 2024-03-03 ENCOUNTER — Encounter: Payer: Self-pay | Admitting: Vascular Surgery

## 2024-03-03 ENCOUNTER — Inpatient Hospital Stay
Admission: RE | Admit: 2024-03-03 | Discharge: 2024-03-04 | DRG: 036 | Disposition: A | Discharge status: Home / Self Care | Attending: Vascular Surgery | Admitting: Vascular Surgery

## 2024-03-03 DIAGNOSIS — Z79899 Other long term (current) drug therapy: Secondary | ICD-10-CM

## 2024-03-03 DIAGNOSIS — I10 Essential (primary) hypertension: Secondary | ICD-10-CM | POA: Diagnosis present

## 2024-03-03 DIAGNOSIS — E119 Type 2 diabetes mellitus without complications: Secondary | ICD-10-CM | POA: Diagnosis present

## 2024-03-03 DIAGNOSIS — I6521 Occlusion and stenosis of right carotid artery: Principal | ICD-10-CM | POA: Diagnosis present

## 2024-03-03 DIAGNOSIS — Z888 Allergy status to other drugs, medicaments and biological substances status: Secondary | ICD-10-CM

## 2024-03-03 DIAGNOSIS — Z7984 Long term (current) use of oral hypoglycemic drugs: Secondary | ICD-10-CM | POA: Diagnosis not present

## 2024-03-03 DIAGNOSIS — Z801 Family history of malignant neoplasm of trachea, bronchus and lung: Secondary | ICD-10-CM | POA: Diagnosis not present

## 2024-03-03 DIAGNOSIS — Z7982 Long term (current) use of aspirin: Secondary | ICD-10-CM

## 2024-03-03 DIAGNOSIS — E7849 Other hyperlipidemia: Secondary | ICD-10-CM | POA: Diagnosis present

## 2024-03-03 DIAGNOSIS — Z7902 Long term (current) use of antithrombotics/antiplatelets: Secondary | ICD-10-CM | POA: Diagnosis not present

## 2024-03-03 HISTORY — PX: CAROTID PTA/STENT INTERVENTION: CATH118231

## 2024-03-03 LAB — POCT ACTIVATED CLOTTING TIME: Activated Clotting Time: 228 s

## 2024-03-03 LAB — GLUCOSE, CAPILLARY
Glucose-Capillary: 108 mg/dL — ABNORMAL HIGH (ref 70–99)
Glucose-Capillary: 78 mg/dL (ref 70–99)
Glucose-Capillary: 59 mg/dL — ABNORMAL LOW (ref 70–99)
Glucose-Capillary: 62 mg/dL — ABNORMAL LOW (ref 70–99)
Glucose-Capillary: 88 mg/dL (ref 70–99)
Glucose-Capillary: 92 mg/dL (ref 70–99)

## 2024-03-03 LAB — CREATININE, SERUM
Creatinine, Ser: 0.9 mg/dL (ref 0.61–1.24)
GFR, Estimated: >60 mL/min (ref >60)

## 2024-03-03 LAB — BUN: BUN: 18 mg/dL (ref 8–23)

## 2024-03-03 LAB — MRSA NEXT GEN BY PCR, NASAL: MRSA by PCR Next Gen: NOT DETECTED

## 2024-03-03 SURGERY — CAROTID PTA/STENT INTERVENTION
Anesthesia: Moderate Sedation | Laterality: Right

## 2024-03-03 MED ORDER — IODIXANOL 320 MG/ML IV SOLN
INTRAVENOUS | Status: DC | PRN
  Administered 2024-03-03: 50 mL

## 2024-03-03 MED ORDER — MIDAZOLAM HCL (PF) 2 MG/2ML IJ SOLN
INTRAMUSCULAR | Status: DC | PRN
  Administered 2024-03-03 (×2): 1 mg via INTRAVENOUS

## 2024-03-03 MED ORDER — LABETALOL HCL 5 MG/ML IV SOLN: 10.0000 mg | INTRAVENOUS | Status: DC | PRN

## 2024-03-03 MED ORDER — ONDANSETRON HCL 4 MG/2ML IJ SOLN: 4.0000 mg | Freq: Four times a day (QID) | INTRAMUSCULAR | Status: DC | PRN

## 2024-03-03 MED ORDER — HEPARIN SODIUM (PORCINE) 1000 UNIT/ML IJ SOLN
INTRAMUSCULAR | Status: DC | PRN
  Administered 2024-03-03: 2000 [IU] via INTRAVENOUS
  Administered 2024-03-03: 9000 [IU] via INTRAVENOUS

## 2024-03-03 MED ORDER — FENTANYL CITRATE (PF) 100 MCG/2ML IJ SOLN
INTRAMUSCULAR | Status: DC | PRN
  Administered 2024-03-03: 50 ug via INTRAVENOUS

## 2024-03-03 MED ORDER — DEXTROSE 50 % IV SOLN: 25.0000 mL | Freq: Four times a day (QID) | INTRAVENOUS | Status: DC | PRN

## 2024-03-03 MED ORDER — SODIUM CHLORIDE 0.9 % IV BOLUS
500.0000 mL | Freq: Once | INTRAVENOUS | Status: AC
  Administered 2024-03-03: 500 mL via INTRAVENOUS

## 2024-03-03 MED ORDER — ATROPINE SULFATE 1 MG/10ML IJ SOSY
PREFILLED_SYRINGE | INTRAMUSCULAR | Status: AC
  Filled 2024-03-03: qty 20

## 2024-03-03 MED ORDER — ASPIRIN 81 MG PO TBEC
81.0000 mg | DELAYED_RELEASE_TABLET | Freq: Every day | ORAL | Status: DC
Start: 2024-03-04 — End: 2024-03-04
  Administered 2024-03-04: 81 mg via ORAL
  Filled 2024-03-03: qty 1

## 2024-03-03 MED ORDER — PHENOL 1.4 % MT LIQD: 1.0000 | OROMUCOSAL | Status: DC | PRN

## 2024-03-03 MED ORDER — POTASSIUM CHLORIDE CRYS ER 20 MEQ PO TBCR: 40.0000 meq | EXTENDED_RELEASE_TABLET | Freq: Every day | ORAL | Status: DC | PRN

## 2024-03-03 MED ORDER — ACETAMINOPHEN 325 MG RE SUPP: 325.0000 mg | RECTAL | Status: DC | PRN

## 2024-03-03 MED ORDER — HYDROMORPHONE HCL 1 MG/ML IJ SOLN: 1.0000 mg | Freq: Once | INTRAMUSCULAR | Status: DC | PRN

## 2024-03-03 MED ORDER — CHLORHEXIDINE GLUCONATE CLOTH 2 % EX PADS
6.0000 | MEDICATED_PAD | Freq: Every day | CUTANEOUS | Status: DC
  Administered 2024-03-03: 6 via TOPICAL

## 2024-03-03 MED ORDER — CEFAZOLIN SODIUM-DEXTROSE 2-4 GM/100ML-% IV SOLN
2.0000 g | Freq: Three times a day (TID) | INTRAVENOUS | Status: AC
  Administered 2024-03-03 (×2): 2 g via INTRAVENOUS
  Filled 2024-03-03 (×2): qty 100

## 2024-03-03 MED ORDER — ATORVASTATIN CALCIUM 10 MG PO TABS
10.0000 mg | ORAL_TABLET | Freq: Every day | ORAL | Status: DC
  Administered 2024-03-04: 10 mg via ORAL
  Filled 2024-03-03: qty 1

## 2024-03-03 MED ORDER — METOPROLOL TARTRATE 5 MG/5ML IV SOLN: 2.5000 mg | INTRAVENOUS | Status: DC | PRN

## 2024-03-03 MED ORDER — PHENYLEPHRINE HCL-NACL 20-0.9 MG/250ML-% IV SOLN
0.0000 ug/min | INTRAVENOUS | Status: DC
  Filled 2024-03-03: qty 250

## 2024-03-03 MED ORDER — DIPHENHYDRAMINE HCL 50 MG/ML IJ SOLN: 50.0000 mg | Freq: Once | INTRAMUSCULAR | Status: DC | PRN

## 2024-03-03 MED ORDER — METHYLPREDNISOLONE SODIUM SUCC 125 MG IJ SOLR: 125.0000 mg | Freq: Once | INTRAMUSCULAR | Status: DC | PRN

## 2024-03-03 MED ORDER — FAMOTIDINE 20 MG PO TABS: 40.0000 mg | ORAL_TABLET | Freq: Once | ORAL | Status: DC | PRN

## 2024-03-03 MED ORDER — HEPARIN SODIUM (PORCINE) 1000 UNIT/ML IJ SOLN
INTRAMUSCULAR | Status: AC
  Filled 2024-03-03: qty 20

## 2024-03-03 MED ORDER — ACETAMINOPHEN 325 MG PO TABS
325.0000 mg | ORAL_TABLET | ORAL | Status: DC | PRN
  Administered 2024-03-03: 650 mg via ORAL

## 2024-03-03 MED ORDER — ACETAMINOPHEN 325 MG PO TABS
ORAL_TABLET | ORAL | Status: AC
  Filled 2024-03-03: qty 2

## 2024-03-03 MED ORDER — IBUPROFEN 200 MG PO TABS: 200.0000 mg | ORAL_TABLET | Freq: Four times a day (QID) | ORAL | Status: DC | PRN

## 2024-03-03 MED ORDER — SODIUM CHLORIDE 0.9 % IV SOLN: INTRAVENOUS | Status: DC

## 2024-03-03 MED ORDER — CLOPIDOGREL BISULFATE 75 MG PO TABS
75.0000 mg | ORAL_TABLET | Freq: Every day | ORAL | Status: DC
  Administered 2024-03-04: 75 mg via ORAL
  Filled 2024-03-03: qty 1

## 2024-03-03 MED ORDER — PIOGLITAZONE HCL 30 MG PO TABS
30.0000 mg | ORAL_TABLET | Freq: Every day | ORAL | Status: DC
  Administered 2024-03-04: 30 mg via ORAL
  Filled 2024-03-03: qty 1

## 2024-03-03 MED ORDER — FENTANYL CITRATE (PF) 100 MCG/2ML IJ SOLN
INTRAMUSCULAR | Status: AC
  Filled 2024-03-03: qty 2

## 2024-03-03 MED ORDER — ATROPINE SULFATE 1 MG/10ML IJ SOSY
PREFILLED_SYRINGE | INTRAMUSCULAR | Status: DC | PRN
  Administered 2024-03-03: 1 mg via INTRAVENOUS

## 2024-03-03 MED ORDER — SODIUM CHLORIDE 0.9 % IV SOLN: 500.0000 mL | Freq: Once | INTRAVENOUS | Status: DC | PRN

## 2024-03-03 MED ORDER — CEFAZOLIN SODIUM-DEXTROSE 2-4 GM/100ML-% IV SOLN
2.0000 g | INTRAVENOUS | Status: AC
  Administered 2024-03-03: 2 g via INTRAVENOUS

## 2024-03-03 MED ORDER — MORPHINE SULFATE (PF) 2 MG/ML IV SOLN: 2.0000 mg | INTRAVENOUS | Status: DC | PRN

## 2024-03-03 MED ORDER — GLIPIZIDE ER 5 MG PO TB24: 5.0000 mg | ORAL_TABLET | Freq: Every day | ORAL | Status: DC

## 2024-03-03 MED ORDER — PHENYLEPHRINE HCL-NACL 20-0.9 MG/250ML-% IV SOLN
INTRAVENOUS | Status: AC
  Filled 2024-03-03: qty 250

## 2024-03-03 MED ORDER — MIDAZOLAM HCL 5 MG/5ML IJ SOLN
INTRAMUSCULAR | Status: AC
  Filled 2024-03-03: qty 5

## 2024-03-03 MED ORDER — PHENYLEPHRINE 80 MCG/ML (10ML) SYRINGE FOR IV PUSH (FOR BLOOD PRESSURE SUPPORT)
PREFILLED_SYRINGE | INTRAVENOUS | Status: AC
  Filled 2024-03-03: qty 10

## 2024-03-03 MED ORDER — LIDOCAINE-EPINEPHRINE (PF) 1 %-1:200000 IJ SOLN
INTRAMUSCULAR | Status: DC | PRN
  Administered 2024-03-03: 10 mL

## 2024-03-03 MED ORDER — MIDAZOLAM HCL 2 MG/ML PO SYRP: 8.0000 mg | ORAL_SOLUTION | Freq: Once | ORAL | Status: DC | PRN

## 2024-03-03 MED ORDER — HYDRALAZINE HCL 20 MG/ML IJ SOLN: 5.0000 mg | INTRAMUSCULAR | Status: DC | PRN

## 2024-03-03 MED ORDER — HEPARIN (PORCINE) IN NACL 1000-0.9 UT/500ML-% IV SOLN
INTRAVENOUS | Status: DC | PRN
  Administered 2024-03-03: 1000 mL

## 2024-03-03 SURGICAL SUPPLY — 18 items
BALLOON VIATRAC 5X30X135 (BALLOONS) IMPLANT
CATH ANGIO 5F PIGTAIL 100CM (CATHETERS) IMPLANT
CATH BEACON 5 .035 100 H1 TIP (CATHETERS) IMPLANT
COVER PROBE ULTRASOUND 5X96 (MISCELLANEOUS) IMPLANT
DEVICE EMBOSHIELD NAV6 4.0-7.0 (FILTER) IMPLANT
DEVICE PRESTO INFLATION (MISCELLANEOUS) IMPLANT
DEVICE STARCLOSE SE CLOSURE (Vascular Products) IMPLANT
DEVICE TORQUE (MISCELLANEOUS) IMPLANT
GLIDEWIRE ANGLED SS 035X260CM (WIRE) IMPLANT
KIT CAROTID MANIFOLD (MISCELLANEOUS) IMPLANT
PACK ANGIOGRAPHY (CUSTOM PROCEDURE TRAY) ×1 IMPLANT
SHEATH BRITE TIP 6FRX11 (SHEATH) IMPLANT
SHEATH SHUTTLE 6FR (SHEATH) IMPLANT
STENT XACT CAR 9-7X40X136 (Permanent Stent) IMPLANT
SUT MNCRL AB 4-0 PS2 18 (SUTURE) IMPLANT
SYR MEDRAD MARK 7 150ML (SYRINGE) IMPLANT
WIRE G VAS 035X260 STIFF (WIRE) IMPLANT
WIRE J 3MM .035X145CM (WIRE) IMPLANT

## 2024-03-03 NOTE — Progress Notes (Signed)
 Dr. Marea in at bedside, speaking with pt.,his wife and son re: procedure. All parties verbalized understanding of conversation with MD. MD aware BP remains in 90's: 2nd NS bolus 500 ml started. Pt. Med. With tylenol 650 mg p.o. for c/o all over neck stiffness post procedure.

## 2024-03-03 NOTE — Inpatient Diabetes Management (Signed)
 Inpatient Diabetes Program Recommendations  AACE/ADA: New Consensus Statement on Inpatient Glycemic Control (2015)  Target Ranges:  Prepandial:   less than 140 mg/dL      Peak postprandial:   less than 180 mg/dL (1-2 hours)      Critically ill patients:  140 - 180 mg/dL   Lab Results  Component Value Date   GLUCAP 88 03/03/2024   HGBA1C 7.0 (A) 07/28/2020    Review of Glycemic Control  Latest Reference Range & Units 03/03/24 09:15 03/03/24 10:57 03/03/24 11:02 03/03/24 11:42  Glucose-Capillary 70 - 99 mg/dL 78 62 (L) 59 (L) 88   Diabetes history: DM 2 Outpatient Diabetes medications:  Glucotrol  XL 10 mg daily Actos 30 mg daily Inpatient Diabetes Program Recommendations:    Recommend holding Glucotrol  while in the hospital due to increased risk for hypoglycemia.   Thanks,  Randall Bullocks, RN, BC-ADM Inpatient Diabetes Coordinator Pager [phone]  (8a-5p)

## 2024-03-03 NOTE — Op Note (Signed)
 OPERATIVE NOTE DATE: 03/03/2024  PROCEDURE:  Ultrasound guidance for vascular access right femoral artery  Placement of a 9 mm proximal 7 mm distal, 4 cm long Exact stent with the use of the NAV-6 embolic protection device in the right carotid artery  PRE-OPERATIVE DIAGNOSIS: 1. High grade right carotid artery stenosis.   POST-OPERATIVE DIAGNOSIS:  Same as above  SURGEON: Selinda Gu, MD  ASSISTANT(S):  none  ANESTHESIA: local/MCS  ESTIMATED BLOOD LOSS:  25 cc  CONTRAST: 50 cc  FLUORO TIME: 4 minutes  MODERATE CONSCIOUS SEDATION TIME:  Approximately 39 minutes using 2 mg of Versed and 50 mcg of Fentanyl   FINDING(S): 1.   90% right internal carotid artery stenosis  SPECIMEN(S):   none  INDICATIONS:   Patient is a 68 y.o. male who presents with high-grade right internal carotid artery stenosis.  The patient has a relatively long and high lesion that may be difficult to access surgically and carotid artery stenting was felt to be preferred to endarterectomy for that reason. I have completed Share Decision Making with Fairy JONETTA Pica prior to surgery.  Conversations included: -Discussion of all treatment options including carotid endarterectomy (CEA), CAS (which includes transcarotid artery revascularization (TCAR)), and optimal medical therapy (OMT)). -Explanation of risks and benefits for each option specific to Fairy JONETTA Mckusick clinical situation. -Integration of clinical guidelines as it relates to the patient's history and co-morbidities -Discussion and incorporation of JANSEL VONSTEIN and their personal preferences and priorities in choosing a treatment plan.  If patient was unable to participate in Shared Decision Making this process was done with the patient  Risks and benefits were discussed and informed consent was obtained.   DESCRIPTION: After obtaining full informed written consent, the patient was brought back to the vascular suite and placed supine upon the table.   The patient received IV antibiotics prior to induction. Moderate conscious sedation was administered during a face to face encounter with the patient throughout the procedure with my supervision of the RN administering medicines and monitoring the patients vital signs and mental status throughout from the start of the procedure until the patient was taken to the recovery room.  After obtaining adequate anesthesia, the patient was prepped and draped in the standard fashion.   The right femoral artery was visualized with ultrasound and found to be widely patent. It was then accessed under direct ultrasound guidance without difficulty with a Seldinger needle. A permanent image was recorded. A J-wire was placed and we then placed a 6 French sheath. The patient was then heparinized and a total of 11,000 units of intravenous heparin were given and an ACT was checked to confirm successful anticoagulation. A pigtail catheter was then placed into the ascending aorta. This showed a type I aortic arch with a near bovine configuration but the left common carotid artery did appear to have a slightly separate origin.  There were no proximal stenoses in the great vessels. I then selectively cannulated the innominate artery without difficulty with a headhunter catheter and advanced into the mid right common carotid artery.  Cervical and cerebral carotid angiography was then performed. There were no obvious intracranial filling defects with minimal cross-filling right to left. The carotid bifurcation demonstrated a relatively long lesion with moderate calcification that was in the 85 to 90% range.  I then advanced into the external carotid artery with a Glidewire and the headhunter catheter and then exchanged for the Amplatz Super Stiff wire. Over the Amplatz Super Stiff wire,  a 6 French shuttle sheath was placed into the mid common carotid artery. I then used the NAV-6  Embolic protection device and crossed the lesion and parked  this in the distal internal carotid artery at the base of the skull.  I then selected a 9 mm proximal, 7 mm distal, 4 cm long exact stent. This was deployed across the lesion encompassing it in its entirety. A 5 mm diameter by 3 cm length balloon was used to post dilate the stent. Only about a 15% residual stenosis was present after angioplasty. Completion angiogram showed normal intracranial filling without new defects. At this point I elected to terminate the procedure. The sheath was removed and StarClose closure device was deployed in the right femoral artery with excellent hemostatic result. The patient was taken to the recovery room in stable condition having tolerated the procedure well.  COMPLICATIONS: none  CONDITION: stable  Selinda Gu 03/03/2024 9:10 AM   This note was created with Dragon Medical transcription system. Any errors in dictation are purely unintentional.

## 2024-03-03 NOTE — Progress Notes (Addendum)
 1045 Patient admitted to ICU via bed from specials PACU. Patient alert and oriented. CHG bath done. 1057 Admission CBG done. Blood sugar 60. CBG rechecked. Blood sugar 58. Order for D50 obtained, 1115 Patient received 25 ml of D50 bristojet given.  1130 Blood sugar 92. Patient's B/P up and down but patient alert and oriented. 1241 Paient's blood pressure continues to decreased. Dr. Marea informed. Order for NS bolus received.Blood pressure increased slightly.  1444 Blood pressure down again. Patient alert and oriented. Patient stated he normally runs a low B/P at home. Dr. Marea informed. Orders to hold on blood pressure treatment until patient symptomatic or MAP remains below 65. 1500 Ambulated to chair without issues. 1600 Repositioned self in chair. 1800 Repositioned self in chair.

## 2024-03-03 NOTE — Interval H&P Note (Signed)
 History and Physical Interval Note:  03/03/2024 8:13 AM  Mitchell Whitehead  has presented today for surgery, with the diagnosis of R Carotid Stent   ABBOTT   Carotid artery stenosis.  The various methods of treatment have been discussed with the patient and family. After consideration of risks, benefits and other options for treatment, the patient has consented to  Procedure(s): CAROTID PTA/STENT INTERVENTION (Right) as a surgical intervention.  The patient's history has been reviewed, patient examined, no change in status, stable for surgery.  I have reviewed the patient's chart and labs.  Questions were answered to the patient's satisfaction.     Shamond Skelton

## 2024-03-04 DIAGNOSIS — Z95828 Presence of other vascular implants and grafts: Secondary | ICD-10-CM

## 2024-03-04 DIAGNOSIS — Z9889 Other specified postprocedural states: Secondary | ICD-10-CM

## 2024-03-04 LAB — CBC
HCT: 38.5 % — ABNORMAL LOW (ref 39.0–52.0)
Hemoglobin: 12.2 g/dL — ABNORMAL LOW (ref 13.0–17.0)
MCH: 28.2 pg (ref 26.0–34.0)
MCHC: 31.7 g/dL (ref 30.0–36.0)
MCV: 89.1 fL (ref 80.0–100.0)
Platelets: 235 K/uL (ref 150–400)
RBC: 4.32 MIL/uL (ref 4.22–5.81)
RDW: 14.6 % (ref 11.5–15.5)
WBC: 6 K/uL (ref 4.0–10.5)
nRBC: 0 % (ref 0.0–0.2)

## 2024-03-04 LAB — BASIC METABOLIC PANEL WITH GFR
Anion gap: 8 (ref 5–15)
BUN: 23 mg/dL (ref 8–23)
CO2: 24 mmol/L (ref 22–32)
Calcium: 8.6 mg/dL — ABNORMAL LOW (ref 8.9–10.3)
Chloride: 106 mmol/L (ref 98–111)
Creatinine, Ser: 1.02 mg/dL (ref 0.61–1.24)
GFR, Estimated: >60 mL/min (ref >60)
Glucose, Bld: 103 mg/dL — ABNORMAL HIGH (ref 70–99)
Potassium: 4.3 mmol/L (ref 3.5–5.1)
Sodium: 138 mmol/L (ref 135–145)

## 2024-03-04 MED ORDER — OXYCODONE HCL 5 MG PO TABS: 5.0000 mg | ORAL_TABLET | ORAL | 0 refills | Status: AC | PRN

## 2024-03-04 MED ORDER — ACETAMINOPHEN 325 MG PO TABS: 325.0000 mg | ORAL_TABLET | ORAL | 0 refills | Status: AC | PRN

## 2024-03-04 MED ORDER — ASPIRIN 81 MG PO TBEC: 81.0000 mg | DELAYED_RELEASE_TABLET | Freq: Every day | ORAL | 12 refills | Status: AC

## 2024-03-04 NOTE — Discharge Summary (Signed)
 [facility]    Discharge Summary    Patient ID:  Mitchell Whitehead MRN: [id number] DOB/AGE: 68-Jun-1957 68 y.o.  Admit date: 03/03/2024 Discharge date: 03/04/2024 Date of Surgery: 03/03/2024 Surgeon: Surgeon(s): Marea Selinda RAMAN, MD  Admission Diagnosis: Carotid stenosis, right [I65.21]  Discharge Diagnoses:  Carotid stenosis, right [I65.21]  Secondary Diagnoses: Past Medical History:  Diagnosis Date   Degenerative disc disease, lumbar    Diabetes mellitus without complication (HCC)    History of prostatitis    Hypertension    Impingement syndrome of left shoulder    Left buttock pain    Sleep apnea    uses cpap    Procedure(s): CAROTID PTA/STENT INTERVENTION  DATE: 03/03/2024   PROCEDURE:  Ultrasound guidance for vascular access right femoral artery  Placement of a 9 mm proximal 7 mm distal, 4 cm long Exact stent with the use of the NAV-6 embolic protection device in the right carotid artery  Discharged Condition: good  HPI:  Patient underwent right carotid endovascular stent placement.  No complications or difficulties to note postprocedure.  Patient's vitals all remained stable.  Patient is ambulating well, eating well and urinating well.  Patient to be discharged later today.  Patient is being discharged on aspirin 81 mg daily, Plavix 75 mg daily and Lipitor 10 mg daily.  Patient was instructed and counseled not to skip or miss taking any of these medications as it will alter the outcome of his procedure.  Patient verbalizes understanding.  I spent greater than 60 minutes in developing, implementing, teaching and discharging this patient today.  Hospital Course:  Mitchell Whitehead is a 68 y.o. male is S/P Right Endovascular Carotid Stent Placement  Extubated: POD # 0 Physical Exam:  Alert notes x3, no acute distress Face: Symmetrical.  Tongue is midline. Neck: Trachea is midline.  No swelling or bruising. Cardiovascular: Regular rate and  rhythm Pulmonary: Clear to auscultation bilaterally Abdomen: Soft, nontender, nondistended Right groin access: Clean dry and intact.  No swelling or drainage noted Left lower extremity: Thigh soft.  Calf soft.  Extremities warm distally toes.  Hard to palpate pedal pulses however the foot is warm is her good capillary refill. Right lower extremity: Thigh soft.  Calf soft.  Extremities warm distally toes.  Hard to palpate pedal pulses however the foot is warm is her good capillary refill. Neurological: No deficits noted   Post-op wounds:  clean, dry, intact or healing well  Pt. Ambulating, voiding and taking PO diet without difficulty. Pt pain controlled with PO pain meds.  Labs:  As below  Complications: none  Consults:    Significant Diagnostic Studies: CBC Lab Results  Component Value Date   WBC 6.0 03/04/2024   HGB 12.2 (L) 03/04/2024   HCT 38.5 (L) 03/04/2024   MCV 89.1 03/04/2024   PLT 235 03/04/2024    BMET    Component Value Date/Time   NA 138 03/04/2024 0640   NA 139 02/23/2020 0909   K 4.3 03/04/2024 0640   CL 106 03/04/2024 0640   CO2 24 03/04/2024 0640   GLUCOSE 103 (H) 03/04/2024 0640   BUN 23 03/04/2024 0640   BUN 20 02/23/2020 0909   CREATININE 1.02 03/04/2024 0640   CALCIUM  8.6 (L) 03/04/2024 0640   GFRNONAA >60 03/04/2024 0640   GFRAA 105 02/23/2020 0909   COAG No results found for: INR, PROTIME   Disposition:  Discharge to :Home  Allergies as of 03/04/2024  Reactions   Grapeseed Extract [nutritional Supplements]         Medication List     STOP taking these medications    ibuprofen 200 MG tablet Commonly known as: ADVIL       TAKE these medications    acetaminophen 325 MG tablet Commonly known as: TYLENOL Take 1-2 tablets (325-650 mg total) by mouth every 4 (four) hours as needed for up to 5 days for mild pain (pain score 1-3) or fever (or temp >/= 101 F).   aspirin EC 81 MG tablet Take 1 tablet (81 mg total)  by mouth daily at 6 (six) AM. Swallow whole. Start taking on: March 05, 2024   atorvastatin  10 MG tablet Commonly known as: LIPITOR Take 1 tablet (10 mg total) by mouth daily.   clopidogrel 75 MG tablet Commonly known as: PLAVIX Take 1 tablet (75 mg total) by mouth daily.   etodolac 400 MG tablet Commonly known as: LODINE Take 400 mg by mouth 2 (two) times daily.   glipiZIDE  5 MG 24 hr tablet Commonly known as: GLUCOTROL  XL Take 1 tablet (5 mg total) by mouth daily with breakfast.   glipiZIDE  10 MG 24 hr tablet Commonly known as: GLUCOTROL  XL Take 10 mg by mouth daily.   oxyCODONE 5 MG immediate release tablet Commonly known as: Roxicodone Take 1 tablet (5 mg total) by mouth every 4 (four) hours as needed for severe pain (pain score 7-10).   pioglitazone 30 MG tablet Commonly known as: ACTOS Take 30 mg by mouth daily.   tamsulosin 0.4 MG Caps capsule Commonly known as: FLOMAX Take 0.4 mg by mouth daily.       Verbal and written Discharge instructions given to the patient. Wound care per Discharge AVS  Follow-up Information     Marea Selinda RAMAN, MD Follow up on 03/26/2024.   Specialties: Vascular Surgery, Radiology, Interventional Cardiology Why: Carotid ultrasounds for post stent placement APPOINTMENT  NOV 26 AT 3PM DR DEW. Contact information: [address] [place] [address] [phone]                 Signed: Gwendlyn JONELLE Shank, NP  03/04/2024, 9:45 AM

## 2024-03-04 NOTE — Plan of Care (Signed)
  Problem: Education: Goal: Knowledge of General Education information will improve Description: Including pain rating scale, medication(s)/side effects and non-pharmacologic comfort measures Outcome: Progressing   Problem: Health Behavior/Discharge Planning: Goal: Ability to manage health-related needs will improve Outcome: Progressing   Problem: Clinical Measurements: Goal: Ability to maintain clinical measurements within normal limits will improve Outcome: Progressing Goal: Will remain free from infection Outcome: Progressing Goal: Diagnostic test results will improve Outcome: Progressing Goal: Respiratory complications will improve Outcome: Progressing Goal: Cardiovascular complication will be avoided Outcome: Progressing   Problem: Activity: Goal: Risk for activity intolerance will decrease Outcome: Progressing   Problem: Nutrition: Goal: Adequate nutrition will be maintained Outcome: Progressing   Problem: Pain Managment: Goal: General experience of comfort will improve and/or be controlled Outcome: Progressing

## 2024-03-04 NOTE — Discharge Instructions (Signed)
 Vascular Surgery Discharge Instructions:  Do not lift anything heavy for the next 2 weeks.  Do not lift anything more than a gallon of milk.  Do not drive for the next week.  Do not drive while taking any narcotic pain medication.  You may shower tomorrow on Wednesday, 03/05/2024 after you get home.  Shower with the old dressing in place.  Remove the old dressing immediately after showering and pat the area completely dry.  Cover the puncture insertion site with a Band-Aid for the next 3 days.  You are being discharged on aspirin 81 mg daily, Plavix 75 mg daily and Lipitor 10 mg daily.  Please do not miss or skip taking any of these medications as it will alter the outcome of your procedure.  Follow-up with vein and vascular surgery as scheduled.

## 2024-03-04 NOTE — Progress Notes (Signed)
 0800 Awake and alert. Patient drinking coffee. Messaged B. Pace NP for discharge instructions.  1000 IVs discontinued and discharge instructions given. Questions answered.

## 2024-03-25 ENCOUNTER — Other Ambulatory Visit (INDEPENDENT_AMBULATORY_CARE_PROVIDER_SITE_OTHER): Payer: Self-pay | Admitting: Vascular Surgery

## 2024-03-25 DIAGNOSIS — I6523 Occlusion and stenosis of bilateral carotid arteries: Secondary | ICD-10-CM

## 2024-03-26 ENCOUNTER — Ambulatory Visit (INDEPENDENT_AMBULATORY_CARE_PROVIDER_SITE_OTHER): Admitting: Nurse Practitioner

## 2024-03-26 ENCOUNTER — Encounter (INDEPENDENT_AMBULATORY_CARE_PROVIDER_SITE_OTHER): Payer: Self-pay | Admitting: Nurse Practitioner

## 2024-03-26 ENCOUNTER — Other Ambulatory Visit (INDEPENDENT_AMBULATORY_CARE_PROVIDER_SITE_OTHER)

## 2024-03-26 VITALS — BP 115/74 | HR 69 | Resp 18 | Wt 266.0 lb

## 2024-03-26 DIAGNOSIS — I6523 Occlusion and stenosis of bilateral carotid arteries: Secondary | ICD-10-CM

## 2024-03-26 DIAGNOSIS — I6521 Occlusion and stenosis of right carotid artery: Secondary | ICD-10-CM

## 2024-03-31 ENCOUNTER — Encounter (INDEPENDENT_AMBULATORY_CARE_PROVIDER_SITE_OTHER): Payer: Self-pay | Admitting: Nurse Practitioner

## 2024-03-31 NOTE — Progress Notes (Signed)
 Subjective:    Patient ID: Mitchell Whitehead, male    DOB: [birth year], 68 y.o.   MRN: [id number] Chief Complaint  Patient presents with   Follow-up    [facility] 3 week with carotid ultrasound follow up    HPI  Discussed the use of AI scribe software for clinical note transcription with the patient, who gave verbal consent to proceed.  History of Present Illness Mitchell Whitehead is a 68 year old male with carotid artery stenosis who presents for follow-up after stent placement.  He underwent a stent placement on the right carotid artery due to significant stenosis on 03/03/2024. Post-procedure, he feels well with no issues related to blood pressure or groin. An ultrasound was performed after the procedure. The left carotid artery has some disease, as previously discussed with the patient.  He experiences consistent lightheadedness, which he attributes to changes in his diabetes medication. After discontinuing one diabetes pill, he felt better, but his glucose levels increased, prompting him to resume half a pill, which caused him to feel 'weird' again. He does not believe the lightheadedness is related to his vascular issues.  He has left shoulder pain, especially with use, and has an upcoming appointment with an orthopedic specialist. He also experiences daily palpitations, describing them as heart pounding for no reason, sometimes felt in his ears. He has not yet consulted a cardiologist for this issue.  He describes nerve pain in his leg, which began after hospitalization, possibly due to nerve damage from swelling or positioning. The pain is excruciating when lifting the leg initially but now mainly hurts when touched or rubbed. He has a history of back problems and sciatica, which he manages.  He has experienced dizzy spells for years, sometimes feeling like he might fall. He also reports blurry vision in his right eye, which started about a year ago and has not fully resolved. He has an  upcoming eye exam to further evaluate this issue.    Results RADIOLOGY Right carotid artery ultrasound: Stent patent, less than 50% stenosis Left carotid artery ultrasound: 40-59% stenosis Vertebral artery ultrasound: Bidirectional flow   Review of Systems  Musculoskeletal:  Positive for arthralgias.  All other systems reviewed and are negative.      Objective:   Physical Exam Vitals reviewed.  HENT:     Head: Normocephalic.  Neck:     Vascular: No carotid bruit.  Cardiovascular:     Rate and Rhythm: Normal rate.     Pulses: Normal pulses.  Pulmonary:     Effort: Pulmonary effort is normal.  Skin:    General: Skin is warm and dry.  Neurological:     Mental Status: He is alert and oriented to person, place, and time.  Psychiatric:        Mood and Affect: Mood normal.        Behavior: Behavior normal.        Thought Content: Thought content normal.        Judgment: Judgment normal.     Physical Exam CARDIOVASCULAR: Heart sounds normal, no bruit. NEUROLOGICAL: Cranial nerves grossly intact. Motor function intact with good grip strength.  BP 115/74 (BP Location: Left Arm)   Pulse 69   Resp 18   Wt 266 lb (120.7 kg)   BMI 34.15 kg/m   Past Medical History:  Diagnosis Date   Degenerative disc disease, lumbar    Diabetes mellitus without complication (HCC)    History of prostatitis    Hypertension  Impingement syndrome of left shoulder    Left buttock pain    Sleep apnea    uses cpap    Social History   Socioeconomic History   Marital status: Married    Spouse name: Ramona   Number of children: 1   Years of education: Not on file   Highest education level: Not on file  Occupational History   Not on file  Tobacco Use   Smoking status: Never   Smokeless tobacco: Never  Vaping Use   Vaping status: Never Used  Substance and Sexual Activity   Alcohol use: Yes    Alcohol/week: 7.0 standard drinks of alcohol    Types: 7 Cans of beer per week     Comment: Daily or almost daily : occ. vodka   Drug use: No   Sexual activity: Yes  Other Topics Concern   Not on file  Social History Narrative   Lives with wife Ramona; 1 son Will who lives in town    Social Drivers of Health   Financial Resource Strain: Low Risk  (09/05/2023)   Received from [facility]   Overall Financial Resource Strain (CARDIA)    Difficulty of Paying Living Expenses: Not hard at all  Food Insecurity: No Food Insecurity (03/04/2024)   Hunger Vital Sign    Worried About Running Out of Food in the Last Year: Never true    Ran Out of Food in the Last Year: Never true  Transportation Needs: No Transportation Needs (03/04/2024)   PRAPARE - Administrator, Civil Service (Medical): No    Lack of Transportation (Non-Medical): No  Physical Activity: Not on file  Stress: Not on file  Social Connections: Socially Integrated (03/04/2024)   Social Connection and Isolation Panel    Frequency of Communication with Friends and Family: Three times a week    Frequency of Social Gatherings with Friends and Family: Three times a week    Attends Religious Services: More than 4 times per year    Active Member of Clubs or Organizations: Yes    Attends Banker Meetings: More than 4 times per year    Marital Status: Married  Catering Manager Violence: Not At Risk (03/04/2024)   Humiliation, Afraid, Rape, and Kick questionnaire    Fear of Current or Ex-Partner: No    Emotionally Abused: No    Physically Abused: No    Sexually Abused: No    Past Surgical History:  Procedure Laterality Date   BACK SURGERY     CAROTID PTA/STENT INTERVENTION Right 03/03/2024   Procedure: CAROTID PTA/STENT INTERVENTION;  Surgeon: Marea Selinda RAMAN, MD;  Location: [facility] INVASIVE CV LAB;  Service: Cardiovascular;  Laterality: Right;   COLONOSCOPY     COLONOSCOPY WITH PROPOFOL  N/A 06/15/2017   Procedure: COLONOSCOPY WITH PROPOFOL ;  Surgeon: Viktoria Lamar DASEN, MD;   Location: [facility] ENDOSCOPY;  Service: Endoscopy;  Laterality: N/A;   WISDOM TOOTH EXTRACTION      Family History  Problem Relation Age of Onset   Lung cancer Mother     Allergies  Allergen Reactions   Grapeseed Extract [Nutritional Supplements]        Latest Ref Rng & Units 03/04/2024    6:40 AM 02/23/2020    9:09 AM 12/23/2018    9:07 AM  CBC  WBC 4.0 - 10.5 K/uL 6.0  6.0  5.2   Hemoglobin 13.0 - 17.0 g/dL 87.7  83.9  84.1   Hematocrit 39.0 - 52.0 %  38.5  48.2  47.6   Platelets 150 - 400 K/uL 235  265  243       CMP     Component Value Date/Time   NA 138 03/04/2024 0640   NA 139 02/23/2020 0909   K 4.3 03/04/2024 0640   CL 106 03/04/2024 0640   CO2 24 03/04/2024 0640   GLUCOSE 103 (H) 03/04/2024 0640   BUN 23 03/04/2024 0640   BUN 20 02/23/2020 0909   CREATININE 1.02 03/04/2024 0640   CALCIUM  8.6 (L) 03/04/2024 0640   PROT 6.9 02/23/2020 0909   ALBUMIN 4.3 02/23/2020 0909   AST 22 02/23/2020 0909   ALT 28 02/23/2020 0909   ALKPHOS 74 02/23/2020 0909   BILITOT 0.5 02/23/2020 0909   GFRNONAA >60 03/04/2024 0640     No results found.     Assessment & Plan:   1. Stenosis of right carotid artery (Primary) Recommend:  The patient is s/p successful right carotid stent  Duplex ultrasound  shows <50%  stenosis.  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 Symptoms persistent. - Monitor symptoms,  consider specialist referral if pain persists or worsens.  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 - Monitor for dizziness or weakness during arm activity.     Current Outpatient Medications on File Prior to Visit  Medication Sig Dispense Refill   aspirin  EC 81 MG tablet Take 1 tablet (81 mg total) by mouth daily at 6 (six) AM. Swallow whole. 30 tablet 12   atorvastatin  (LIPITOR) 10 MG tablet Take 1 tablet (10 mg total) by mouth daily. 90 tablet 1   clopidogrel  (PLAVIX ) 75 MG tablet Take 1 tablet (75 mg total) by mouth daily. 30 tablet 6   etodolac (LODINE) 400 MG tablet Take 400 mg by mouth 2 (two) times daily.     glipiZIDE  (GLUCOTROL  XL) 10 MG 24 hr tablet Take 10 mg by mouth daily.     oxyCODONE  (ROXICODONE ) 5 MG immediate release tablet Take 1 tablet (5 mg total) by mouth every 4 (four) hours as needed for severe pain (pain score 7-10). 30 tablet 0   pioglitazone  (ACTOS ) 30 MG tablet Take 30 mg by mouth daily.     tamsulosin (FLOMAX) 0.4 MG CAPS capsule Take 0.4 mg by mouth daily.     glipiZIDE  (GLUCOTROL  XL) 5 MG 24 hr tablet Take 1 tablet (5 mg total) by mouth daily with breakfast. (Patient not taking: Reported on 03/26/2024) 90 tablet 3   No current facility-administered medications on file prior to visit.    There are no Patient Instructions on file for this visit. No follow-ups on file.   Nychelle Cassata E Nichele Slawson, NP

## 2024-05-08 ENCOUNTER — Other Ambulatory Visit: Payer: Self-pay | Admitting: Surgery

## 2024-05-08 DIAGNOSIS — M7582 Other shoulder lesions, left shoulder: Secondary | ICD-10-CM

## 2024-05-13 ENCOUNTER — Ambulatory Visit
Admission: RE | Admit: 2024-05-13 | Discharge: 2024-05-13 | Disposition: A | Discharge status: Home / Self Care | Source: Ambulatory Visit | Attending: Surgery | Admitting: Surgery

## 2024-05-13 DIAGNOSIS — M7582 Other shoulder lesions, left shoulder: Secondary | ICD-10-CM | POA: Insufficient documentation

## 2024-06-26 ENCOUNTER — Encounter (INDEPENDENT_AMBULATORY_CARE_PROVIDER_SITE_OTHER)

## 2024-06-26 ENCOUNTER — Ambulatory Visit (INDEPENDENT_AMBULATORY_CARE_PROVIDER_SITE_OTHER): Admitting: Nurse Practitioner
